# Patient Record
Sex: Female | Born: 1965 | Race: Black or African American | Hispanic: No | Smoking: Never smoker
Health system: Southern US, Community
[De-identification: ages and names within clinical notes are randomized; demographics above are authoritative.]

## PROBLEM LIST (undated history)

## (undated) DIAGNOSIS — Z8249 Family history of ischemic heart disease and other diseases of the circulatory system: Secondary | ICD-10-CM

## (undated) DIAGNOSIS — Z955 Presence of coronary angioplasty implant and graft: Secondary | ICD-10-CM

## (undated) DIAGNOSIS — Z973 Presence of spectacles and contact lenses: Secondary | ICD-10-CM

## (undated) DIAGNOSIS — K760 Fatty (change of) liver, not elsewhere classified: Secondary | ICD-10-CM

## (undated) DIAGNOSIS — I10 Essential (primary) hypertension: Secondary | ICD-10-CM

## (undated) DIAGNOSIS — I2510 Atherosclerotic heart disease of native coronary artery without angina pectoris: Secondary | ICD-10-CM

## (undated) DIAGNOSIS — N950 Postmenopausal bleeding: Secondary | ICD-10-CM

## (undated) DIAGNOSIS — E785 Hyperlipidemia, unspecified: Secondary | ICD-10-CM

## (undated) DIAGNOSIS — D259 Leiomyoma of uterus, unspecified: Secondary | ICD-10-CM

## (undated) HISTORY — PX: CORONARY ANGIOPLASTY WITH STENT PLACEMENT: SHX49

## (undated) HISTORY — DX: Hyperlipidemia, unspecified: E78.5

---

## 1998-02-26 ENCOUNTER — Inpatient Hospital Stay (HOSPITAL_COMMUNITY): Admission: AD | Admit: 1998-02-26 | Discharge: 1998-02-28 | Payer: Self-pay | Admitting: *Deleted

## 1998-08-24 ENCOUNTER — Other Ambulatory Visit: Admission: RE | Admit: 1998-08-24 | Discharge: 1998-08-24 | Payer: Self-pay | Admitting: Obstetrics & Gynecology

## 1999-11-01 ENCOUNTER — Other Ambulatory Visit: Admission: RE | Admit: 1999-11-01 | Discharge: 1999-11-01 | Payer: Self-pay | Admitting: Obstetrics & Gynecology

## 2000-02-16 ENCOUNTER — Ambulatory Visit (HOSPITAL_COMMUNITY): Admission: RE | Admit: 2000-02-16 | Discharge: 2000-02-16 | Payer: Self-pay | Admitting: *Deleted

## 2000-02-16 ENCOUNTER — Encounter: Payer: Self-pay | Admitting: *Deleted

## 2000-11-26 ENCOUNTER — Other Ambulatory Visit: Admission: RE | Admit: 2000-11-26 | Discharge: 2000-11-26 | Payer: Self-pay | Admitting: Obstetrics and Gynecology

## 2001-02-17 ENCOUNTER — Ambulatory Visit (HOSPITAL_COMMUNITY): Admission: RE | Admit: 2001-02-17 | Discharge: 2001-02-17 | Payer: Self-pay | Admitting: Obstetrics and Gynecology

## 2001-02-17 ENCOUNTER — Encounter: Payer: Self-pay | Admitting: Obstetrics and Gynecology

## 2001-12-11 ENCOUNTER — Other Ambulatory Visit: Admission: RE | Admit: 2001-12-11 | Discharge: 2001-12-11 | Payer: Self-pay | Admitting: Obstetrics and Gynecology

## 2002-06-17 ENCOUNTER — Ambulatory Visit (HOSPITAL_COMMUNITY): Admission: RE | Admit: 2002-06-17 | Discharge: 2002-06-17 | Payer: Self-pay | Admitting: Obstetrics and Gynecology

## 2002-06-17 ENCOUNTER — Encounter: Payer: Self-pay | Admitting: Obstetrics and Gynecology

## 2003-02-18 ENCOUNTER — Other Ambulatory Visit: Admission: RE | Admit: 2003-02-18 | Discharge: 2003-02-18 | Payer: Self-pay | Admitting: Obstetrics and Gynecology

## 2003-08-18 ENCOUNTER — Ambulatory Visit (HOSPITAL_COMMUNITY): Admission: RE | Admit: 2003-08-18 | Discharge: 2003-08-18 | Payer: Self-pay | Admitting: Obstetrics and Gynecology

## 2004-02-09 ENCOUNTER — Ambulatory Visit (HOSPITAL_COMMUNITY): Admission: RE | Admit: 2004-02-09 | Discharge: 2004-02-09 | Payer: Self-pay | Admitting: Obstetrics and Gynecology

## 2004-03-07 ENCOUNTER — Other Ambulatory Visit: Admission: RE | Admit: 2004-03-07 | Discharge: 2004-03-07 | Payer: Self-pay | Admitting: Obstetrics and Gynecology

## 2004-09-20 ENCOUNTER — Ambulatory Visit (HOSPITAL_COMMUNITY): Admission: RE | Admit: 2004-09-20 | Discharge: 2004-09-20 | Payer: Self-pay | Admitting: Obstetrics and Gynecology

## 2005-04-27 ENCOUNTER — Encounter: Admission: RE | Admit: 2005-04-27 | Discharge: 2005-04-27 | Payer: Self-pay | Admitting: Internal Medicine

## 2005-06-21 ENCOUNTER — Encounter: Admission: RE | Admit: 2005-06-21 | Discharge: 2005-06-21 | Payer: Self-pay | Admitting: Internal Medicine

## 2005-07-20 ENCOUNTER — Other Ambulatory Visit: Admission: RE | Admit: 2005-07-20 | Discharge: 2005-07-20 | Payer: Self-pay | Admitting: Obstetrics and Gynecology

## 2005-10-30 ENCOUNTER — Ambulatory Visit (HOSPITAL_COMMUNITY): Admission: RE | Admit: 2005-10-30 | Discharge: 2005-10-30 | Payer: Self-pay | Admitting: Obstetrics and Gynecology

## 2006-03-20 ENCOUNTER — Ambulatory Visit: Payer: Self-pay | Admitting: Internal Medicine

## 2006-03-21 ENCOUNTER — Encounter: Admission: RE | Admit: 2006-03-21 | Discharge: 2006-03-21 | Payer: Self-pay | Admitting: Internal Medicine

## 2006-05-01 ENCOUNTER — Ambulatory Visit: Admission: RE | Admit: 2006-05-01 | Discharge: 2006-05-01 | Payer: Self-pay | Admitting: Gynecologic Oncology

## 2006-11-06 ENCOUNTER — Encounter: Admission: RE | Admit: 2006-11-06 | Discharge: 2006-11-06 | Payer: Self-pay | Admitting: Internal Medicine

## 2006-11-20 ENCOUNTER — Encounter: Admission: RE | Admit: 2006-11-20 | Discharge: 2007-02-18 | Payer: Self-pay | Admitting: Internal Medicine

## 2006-11-22 ENCOUNTER — Ambulatory Visit (HOSPITAL_COMMUNITY): Admission: RE | Admit: 2006-11-22 | Discharge: 2006-11-22 | Payer: Self-pay | Admitting: Internal Medicine

## 2010-04-23 ENCOUNTER — Encounter: Payer: Self-pay | Admitting: Obstetrics and Gynecology

## 2010-08-18 NOTE — Consult Note (Signed)
[name], VAL NO.:  [id number]   MEDICAL RECORD NO.:  [id number]          PATIENT TYPE:  OUT   LOCATION:  GYN                          FACILITY:  [facility]   PHYSICIAN:  Paola A. Duard Brady, MD    DATE OF BIRTH:  Mar 11, 1966   DATE OF CONSULTATION:  05/01/2006  DATE OF DISCHARGE:                                 CONSULTATION   Patient seen today in consultation at request of Dr. Normand Sloop.  [name]  is a 45 year old gravida 3, para 2, aborta 1 whose last cycle is April 30, 2006.  She had an ultrasound secondary to some right lower quadrant  gluteal pain that radiated to her groin and back.  Ultrasound revealed a  submucosal fibroid measuring 1.7 cm and intermural fibroid measuring 3.6  cm, and an ovarian cyst.  Followup resolved resolution of that ovarian  cyst and she had a CA-125 January 16 that was normal at 16.9.  The  cystic mass was noted on CT and it resolved on ultrasound performed  January 2.  Patient comes in because she does have a family history of  her mother having ovarian cancer.  She states her mother was diagnosed  with ovarian cancer at the age of 87.  She delayed being seen and died 6  months after her diagnosis.  In fact, in discussing this with the  patient, she is not sure if her mom had ovarian cancer or some other  pathology.  She never had surgery.  She may have had a biopsy, and it is  unclear if this could have been a possible metastatic pancreatic cancer,  colon cancer or other etiology.  She states that the pain that she had  in the right groin region and is now gone.  Occasionally, with her  menses, she have some right lower quadrant discomfort.  She denies any  change her bowels.  She occasionally has some bladder pressure, which  she was told was secondary to her known fibroid uterus.   PAST MEDICAL HISTORY:  None.   PAST SURGICAL HISTORY:  None.   ALLERGIES:  NONE.   MEDICATIONS:  Z-Pak, which she is completing; omega-3;  multivitamin and  Tums.   SOCIAL HISTORY:  She denies the use of tobacco.  She drinks alcohol  occasionally.  She is married.  She works in Conservation officer, historic buildings.  Her  husband works as a Firefighter for [employer].  They have 2  children, a son who is age 69 and daughter who is 8.   FAMILY HISTORY:  Only for that of her mother dying of a diffuse  malignancy at the age of 28; cell type and origin uncertain.   HEALTH MAINTENANCE:  She is up-to-date on her mammogram.  She is due for  a Pap smear in April.   PHYSICAL EXAMINATION:  VITAL SIGNS:  Height 5 feet 4 inches, weight 138  pounds.  GENERAL:  Well-nourished, well-developed female in no acute distress. A  20-minute face-to-face time was spent in consultation only.  Physical  examination was deferred.  At this point, it is difficult to ascertain whether or not her mother  had ovarian cancer at a young age for an ovarian cancer at the age of 41  without any other family history of malignancy.  While sporadic cancer  could appear, I think we need to determine and get some records on her  mother and then followup consultation.  The way we have left it is, as  the patient is essentially asymptomatic at this time, I will not pursue  with any imaging.  She will follow up with Dr. Normand Sloop for her routine  GYN care in April.  The patient's mother was treated at [facility] in [place], [place].  They will obtain records and  fax them to [place].  Once these records are obtained, I will contact the  patient and see if we need to stratify her risk in any other way.  I  discussed with her that the baseline risk of ovarian cancer is  approximately 1.7%; with one first degree family member, the risk rises  to 5%.  The patient has either been pregnant, breast fed, or been on  birth control pills for more than 5 years in total; therefore, her risk  is decreased by approximately 50%.  However, we will complete her   stratification pending her ability to obtain some records on her  mother's diagnosis.      Paola A. Duard Brady, MD  Electronically Signed     PAG/MEDQ  D:  05/01/2006  T:  05/01/2006  Job:  [id number]

## 2010-08-18 NOTE — Assessment & Plan Note (Signed)
[facility]                         GASTROENTEROLOGY OFFICE NOTE   NAME:[name], [name]                            MRN:          [id number]  DATE:03/20/2006                            DOB:          04-May-1965    Ms. [name] is a very nice 45 year old patient of Dr. Lovell Sheehan. 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 About 3 weeks ago, during the exam  time, Ms. Rusk developed severe epigastric pain. It was related to  stress and long hours of studying the day and night. She was drinking a  lot of caffeinated beverages at the time. She was also taking some  NSAIDs because of back pain. She has taken a few Tums when the pain was  the worst, but her pain has completely resolved since then. She has  passed her exams in Anatomy and Physiology and has been able to relax  for the past couple of weeks, which resulted in complete alleviation of  her abdominal pain. She is now able to eat back to regular diet. She  denies any family history of gallbladder disease or any previous attacks  of abdominal pain. There was brief periods of gastroesophageal reflux  during her 2 pregnancies. She denies any lower GI symptoms of diarrhea  or rectal bleeding.   MEDICATIONS:  Advil p.r.n. low back pain.   PAST MEDICAL HISTORY:  No operations. No cardiac or pulmonary problems.   FAMILY HISTORY:  Ovarian cancer in her mother. Diabetes in grandmother.  Heart disease in father.   SOCIAL HISTORY:  The patient is married, has 2 children. 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 The patient does not smoke, drinks  alcohol only rarely.   REVIEW OF SYSTEMS:  Positive for night sweats and hearing problems.   PHYSICAL EXAMINATION:  Blood pressure 130/72, pulse 62 and weight 142  pounds. She was alert, oriented and in no distress.  LUNGS: Clear to auscultation.  COR: With normal S1, normal S2.  NECK: Supple, without adenopathy.  ABDOMEN: Was relaxed with normoactive  bowel sounds. I could not  reproduce any tenderness in epigastrium, left or right upper quadrant or  costovertebral angle.  RECTAL: Shows normal rectal tone with Hemoccult negative stool.   IMPRESSION:  A 45 year old African American female with episode of  severe epigastric pain, which resolved after her exams in school and was  undoubtedly related to stress. I suspect she had a gastric spasm,  hyperacidity or even gastritis. She does not have any chronic problems  pertaining to her stomach and I feel that her symptoms were also  aggravated by use of non-steroidal anti-inflammatory drugs. For that  reason, I do not feel that further evaluation is warranted unless she  develops recurrent problems. She agrees with the plan.   PLAN:  1. Prilosec 20 mg a day, to use on an as needed basis. Specifically      she ought to take it before her exam period next semester.  2. I would avoid using excess caffeine during the exams as well as  excess non-steroidal anti-inflammatory drugs.  3. If the symptoms of pain recur, I think she will need upper      endoscopy,  other diagnostic tests, possibly upper abdominal      ultrasound for further evaluation of abdominal pain.     [name]. Juanda Chance, MD  Electronically Signed    DMB/MedQ  DD: 03/20/2006  DT: 03/20/2006  Job #: [id number]   cc:   Della Goo, M.D.

## 2010-08-18 NOTE — Consult Note (Signed)
NAMEBRANDE, UNCAPHER NO.:  [id number]   MEDICAL RECORD NO.:  [id number]          PATIENT TYPE:  OUT   LOCATION:  GYN                          [facility]ACILITY:  [facility]   PHYSICIAN:  Paola A. Duard Brady, MD    DATE OF BIRTH:  Aug 29, 1965   DATE OF CONSULTATION:  07/23/2006  DATE OF DISCHARGE:  05/01/2006                                 CONSULTATION   This is a recording of a telephone conversation regarding [name].  [name] is a very pleasant 45 year-old that was referred to me for  family history of ovarian carcinoma regarding the need of close follow-  up.  She was very unclear regarding what her mother's diagnosis was.  Therefore we asked that she receive records from [facility] regarding her mother's condition.  From the records we have her  mother was 68 years old.  Was found to have a large abdominal pelvic  mass with multiple lung metastases.  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  She was transfused and monitored  and managed with best supportive care and expectantly.  She succumbed to  her disease.  I left a voice mail on the patient's cell phone number  stating that uterine sarcomas in and of themselves are not anything that  we screen for in a genetic fashion.  I believe she can proceed with  routine GYN care under the care of Dr. Normand Sloop as she has been.  We  would be happy to see her in the future should there be a concern of a  gynecologic malignancy, but at this time I do not feel that she needs to  be involved in any ovarian cancer screening project or regimen based on  her mother's family history of uterine sarcoma.      Paola A. Duard Brady, MD  Electronically Signed     PAG/MEDQ  D:  07/23/2006  T:  07/23/2006  Job:  [id number]   cc:   Naima A. Normand Sloop, M.D.  Fax: [phone]   Telford Nab, R.N.  [address]  [place], [place] [address]

## 2010-09-26 DIAGNOSIS — Z955 Presence of coronary angioplasty implant and graft: Secondary | ICD-10-CM

## 2010-09-26 HISTORY — DX: Presence of coronary angioplasty implant and graft: Z95.5

## 2010-12-26 ENCOUNTER — Observation Stay (HOSPITAL_COMMUNITY): Payer: BC Managed Care – PPO

## 2010-12-26 ENCOUNTER — Observation Stay (HOSPITAL_COMMUNITY)
Admission: AD | Admit: 2010-12-26 | Discharge: 2010-12-28 | DRG: 854 | Disposition: A | Discharge status: Home / Self Care | Payer: BC Managed Care – PPO | Source: Ambulatory Visit | Attending: Internal Medicine | Admitting: Internal Medicine

## 2010-12-26 ENCOUNTER — Emergency Department (HOSPITAL_COMMUNITY)
Admission: EM | Admit: 2010-12-26 | Discharge: 2010-12-26 | Disposition: A | Discharge status: Home / Self Care | Payer: BC Managed Care – PPO | Attending: Emergency Medicine | Admitting: Emergency Medicine

## 2010-12-26 DIAGNOSIS — R0602 Shortness of breath: Secondary | ICD-10-CM | POA: Insufficient documentation

## 2010-12-26 DIAGNOSIS — I2510 Atherosclerotic heart disease of native coronary artery without angina pectoris: Principal | ICD-10-CM | POA: Insufficient documentation

## 2010-12-26 DIAGNOSIS — I200 Unstable angina: Secondary | ICD-10-CM | POA: Insufficient documentation

## 2010-12-26 DIAGNOSIS — R079 Chest pain, unspecified: Secondary | ICD-10-CM | POA: Insufficient documentation

## 2010-12-26 DIAGNOSIS — I4949 Other premature depolarization: Secondary | ICD-10-CM | POA: Insufficient documentation

## 2010-12-26 DIAGNOSIS — E785 Hyperlipidemia, unspecified: Secondary | ICD-10-CM | POA: Insufficient documentation

## 2010-12-26 DIAGNOSIS — Z8249 Family history of ischemic heart disease and other diseases of the circulatory system: Secondary | ICD-10-CM | POA: Insufficient documentation

## 2010-12-26 LAB — DIFFERENTIAL
Basophils Relative: 0 % (ref 0–1)
Eosinophils Absolute: 0 10*3/uL (ref 0.0–0.7)
Lymphs Abs: 1.3 10*3/uL (ref 0.7–4.0)
Monocytes Relative: 7 % (ref 3–12)
Neutro Abs: 2.6 10*3/uL (ref 1.7–7.7)
Neutrophils Relative %: 62 % (ref 43–77)

## 2010-12-26 LAB — COMPREHENSIVE METABOLIC PANEL
ALT: 19 U/L (ref 0–35)
Albumin: 4.2 g/dL (ref 3.5–5.2)
Alkaline Phosphatase: 60 U/L (ref 39–117)
BUN: 10 mg/dL (ref 6–23)
CO2: 21 mEq/L (ref 19–32)
Calcium: 9.1 mg/dL (ref 8.4–10.5)
GFR calc Af Amer: >60 mL/min (ref >60)
Potassium: 3.8 mEq/L (ref 3.5–5.1)
Sodium: 136 mEq/L (ref 135–145)
Total Protein: 7.4 g/dL (ref 6.0–8.3)

## 2010-12-26 LAB — LIPID PANEL
Cholesterol: 188 mg/dL (ref 0–200)
HDL: 70 mg/dL (ref >39)
LDL Cholesterol: 105 mg/dL — ABNORMAL HIGH (ref 0–99)
Total CHOL/HDL Ratio: 2.7 RATIO
Triglycerides: 63 mg/dL (ref <150)

## 2010-12-26 LAB — CBC
Hemoglobin: 12.7 g/dL (ref 12.0–15.0)
MCH: 28.9 pg (ref 26.0–34.0)
RBC: 4.4 MIL/uL (ref 3.87–5.11)
WBC: 4.3 10*3/uL (ref 4.0–10.5)

## 2010-12-26 LAB — CARDIAC PANEL(CRET KIN+CKTOT+MB+TROPI)
CK, MB: 2.4 ng/mL (ref 0.3–4.0)
CK, MB: 4.4 ng/mL — ABNORMAL HIGH (ref 0.3–4.0)
Relative Index: 2.3 (ref 0.0–2.5)
Total CK: 105 U/L (ref 7–177)
Total CK: 270 U/L — ABNORMAL HIGH (ref 7–177)
Troponin I: <0.3 ng/mL (ref <0.30)
Troponin I: <0.3 ng/mL (ref <0.30)

## 2010-12-26 LAB — APTT: aPTT: 32 seconds (ref 24–37)

## 2010-12-27 LAB — CARDIAC PANEL(CRET KIN+CKTOT+MB+TROPI)
CK, MB: 3.6 ng/mL (ref 0.3–4.0)
Relative Index: 1.4 (ref 0.0–2.5)
Total CK: 249 U/L — ABNORMAL HIGH (ref 7–177)
Troponin I: <0.3 ng/mL (ref <0.30)

## 2010-12-27 LAB — POCT ACTIVATED CLOTTING TIME: Activated Clotting Time: 622 seconds

## 2010-12-28 LAB — BASIC METABOLIC PANEL
Calcium: 8.2 mg/dL — ABNORMAL LOW (ref 8.4–10.5)
Chloride: 108 mEq/L (ref 96–112)
Creatinine, Ser: 0.85 mg/dL (ref 0.50–1.10)
GFR calc Af Amer: >60 mL/min (ref >60)
GFR calc non Af Amer: >60 mL/min (ref >60)
Glucose, Bld: 95 mg/dL (ref 70–99)

## 2010-12-28 LAB — CBC
HCT: 33.5 % — ABNORMAL LOW (ref 36.0–46.0)
MCH: 28.8 pg (ref 26.0–34.0)
MCV: 88.6 fL (ref 78.0–100.0)
Platelets: 159 10*3/uL (ref 150–400)
RDW: 13.5 % (ref 11.5–15.5)

## 2010-12-30 NOTE — Consult Note (Signed)
[name], [name]                ACCOUNT NO.:  [id number]  MEDICAL RECORD NO.:  [id number]  LOCATION:  3734                         FACILITY:  [facility]  PHYSICIAN:  Armanda Magic, M.D.     DATE OF BIRTH:  Mar 30, 1966  DATE OF CONSULTATION:  12/26/2010 DATE OF DISCHARGE:                                CONSULTATION   REFERRING PHYSICIAN:  Della Goo, MD  PRIMARY CARDIOLOGIST:  Lyn Records, MD  CHIEF COMPLAINT:  Chest pain.  HISTORY OF PRESENT ILLNESS:  This is a 45 year old female who was seen Dr. Verdis Prime in 2009 with history of palpitations and was diagnosed with an isolated PVC on a heart monitor.  A 2-D echocardiogram at that time showed normal LV function.  She has some family history of coronary artery disease in early age.  She was seen in our office recently because of left-sided chest pain while exercising and walking, described as the aching sensation in the left over arm and chest whenever she exercises or walks.  It has been going on for about 3 weeks.  She says it only lasted about a minute and then she continues to walk, it resolved, does not return.  She denies any nausea, vomiting, shortness of breath, PND, orthopnea or palpitations or dizziness with the episodes.  Apparently, she has undergone tremendous stress in a nursing school and works as a Lawyer in ICU at [employer].  She is also currently going through divorce and raising her children.  She has been very stressed and fearful that her chest pain and given her family history. She underwent an exercise treadmill test in our office yesterday and reportedly per Dr. Katrinka Blazing, had upsloping ST-segment depression along with chest pain.  She had recurrent chest pain this morning when walking from her car and called Dr. Lovell Sheehan and presented to the hospital for further workup.  She currently is pain free.  PAST MEDICAL HISTORY:  Palpitations with an isolated PVC noted on the heart monitor in 2009.  A 2-D  echocardiogram in June 2009 showed EF of 75%.  FAMILY HISTORY:  Her father died at 36 of CAD.  Her mother died at 49 of a sarcoma.  She has two brothers and a sister.  Her sister and one brother had hypertension.  Her father's side of the family has a history of sudden cardiac death.  SOCIAL HISTORY:  She has never smoked.  She is single.  MEDICATIONS:  Aspirin 81 mg daily.  ALLERGIES:  ERYTHROMYCIN which causes stomach cramps.  PHYSICAL EXAMINATION:  GENERAL:  This is a well-developed, well- nourished black female in no distress. HEENT:  Benign. NECK:  Supple without lymphadenopathy.  Carotid upstrokes 6+ bilaterally, no bruits. LUNGS:  Clear to auscultation throughout. HEART:  Regular rate and rhythm.  No murmurs, rubs, or gallops.  Normal S1 and S2. ABDOMEN:  Soft, nontender, nondistended.  Active bowel sounds.  No hepatosplenomegaly: EXTREMITIES:  No edema.  EKG done in our office on December 15, 2010, showed normal sinus rhythm at 57 beats per minute with no ST changes.  EKG has not been done this admission yet.  Labs are pending.  Chest x-ray pending.  ASSESSMENT:  1. Unstable angina pectoris mainly exertional angina with any type of     exertion now, cardiac enzymes are pending.  Treadmill yesterday in     our office showed upsloping ST depression in the setting of chest     pain.  She is now admitted with recurrent chest pain this morning     after walking from her car.  Currently, she is pain free. 2. Normal left ventricular function by echo in 2009 with systolic     heart murmur on exam today. 3. Premature ventricular contractions with history of palpitations. 4. Family history of coronary artery disease.  PLAN:  We will plan cardiac catheterization in the morning on December 27, 2010, by Dr. Verdis Prime.  We will cycle cardiac enzymes.  We will increase Lovenox to 1 mg/kg until rule out is complete.  Agree with aspirin and nitro paste as ordered by Dr. Lovell Sheehan.   Risks and benefits of cardiac cath explained including the risk of MI, CVA, death, bleeding complications, vascular complications, IV contrast allergy, and renal failure.  The patient understands, all questions were answered, and she agrees to proceed with cardiac catheterization.     Armanda Magic, M.D.     TT/MEDQ  D:  12/26/2010  T:  12/26/2010  Job:  [id number]  cc:   Lyn Records, M.D. Della Goo, M.D.  Electronically Signed by Armanda Magic M.D. on 12/30/2010 04:51:42 PM

## 2010-12-31 NOTE — H&P (Signed)
[name], [name]                ACCOUNT NO.:  [id number]  MEDICAL RECORD NO.:  [id number]  LOCATION:  3734                         FACILITY:  [facility]  PHYSICIAN:  Della Goo, M.D. DATE OF BIRTH:  Jan 04, 1966  DATE OF ADMISSION:  12/26/2010 DATE OF DISCHARGE:                             HISTORY & PHYSICAL   PRIMARY CARE PHYSICIAN:  Della Goo, M.D.  CHIEF COMPLAINT:  Chest pain.  HISTORY OF PRESENT ILLNESS:  This is a 45 year old female with a very strong family history for coronary artery disease who presented with complaints of severe exertional chest pain which starts in the left chest, radiates down the left arm.  She had her last episode of this, this morning, it occurred with exertion while she was walking to her classes.  She came into the office and was brought to the hospital for further evaluation.  The patient reports having shortness of breath and some nausea associated with this.  She denies having any diaphoresis or vomiting.  She has been having intermittent chest pain off and on for the past 3 weeks, which has been similar, but reports that the pain was worse today.  The patient also underwent a exercise stress test performed with [facility] cardiologist Dr. Verdis Prime, results of which were equivocal.  She did suffer ischemic changes at the end of the stress test that were associated with chest pain.  The patient was to have further testing performed based on these conclusions.  An exercise stress test was to be scheduled.  However, the patient continued to have chest pain into the day today and was brought to the emergency department and hospital for admission and further workup.  Cardiology was notified and are also seeing the patient.  PAST MEDICAL HISTORY:  None.  MEDICATIONS: 1. Aspirin 81 mg p.o. daily. 2. Fish oil 1 tablet p.o. daily 1000 units.  ALLERGIES:  ERYTHROMYCIN which is an intolerance causing GI cramping.  SOCIAL HISTORY:  The  patient is divorced, mother of two children.  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  She is a nonsmoker, nondrinker.  No history of illicit drug usage.  FAMILY HISTORY:  Strongly positive for coronary artery disease.  Father deceased in his late 60s of massive MI.  Niece deceased of a massive MI at age 53, 1 year ago.  Paternal uncle is also positive for coronary artery disease and deceased in the 29s.  Mother had probable uterine sarcoma and died of cancer.  REVIEW OF SYSTEMS:  Pertinent are mentioned above in the HPI.  All other organ systems negative.  PHYSICAL EXAMINATION FINDINGS:  GENERAL:  This is a thin, well- nourished, well-developed [place] American female who is in no current discomfort or acute distress. VITAL SIGNS:  On arrival to the floor, temperature 98.9, blood pressure 147/91, heart rate 81, respirations 18, and O2 saturations 100%. HEENT:  Normocephalic, atraumatic.  Pupils are equally round, reactive to light.  Extraocular movements are intact.  Funduscopic benign.  There is no scleral icterus.  Nares are patent bilaterally.  Oropharynx is clear. NECK:  Supple.  Full range of motion.  No thyromegaly, adenopathy, jugular venous distention. CARDIOVASCULAR:  Regular rate and rhythm.  Normal S1 and S2.  No murmurs, gallops, or rubs appreciated. LUNGS:  Clear to auscultation bilaterally.  No rales, rhonchi, or wheezes. ABDOMEN:  Positive bowel sounds, soft, nontender, nondistended.  No hepatosplenomegaly. EXTREMITIES:  Without cyanosis, clubbing, or edema. NEUROLOGIC:  Alert and oriented x3.  No focal deficits.  LABORATORY STUDIES:  Pending at this time.  Admission laboratory studies have been ordered along with cardiac enzymes and a D-dimer.  The admission EKG reveals a normal sinus rhythm and no acute ST-segment changes are seen.  The rate of 75.  A PA and lateral chest x-ray will also be performed.  ASSESSMENT:  A 45 year old female  being admitted with 1. Exertional chest pain. 2. Shortness of breath. 3. Nausea. 4. Abnormal exercise stress test. 5. Elevated blood pressure most likely reactive.  No current history     of hypertension.  PLAN:  The patient has been admitted and she has been placed in a telemetry unit for now.  Cardiac enzymes will be performed along with a D-dimer and a PA and lateral chest x-ray.  Cardiology has seen the patient and has made plans for a cardiac catheterization in the a.m. The patient will be n.p.o. after midnight.  She has been placed on full- dose Lovenox therapy at this time until all of the rule out have been completed.  The A TSH level will be checked along with a fasting lipid panel.  In the past, the patient has had normal cholesterol levels. Admission laboratory studies will be appended to this dictation.  The patient is a full code and further workup will ensue pending results the patient's clinical course and are outlined above.     Della Goo, M.D.     HJ/MEDQ  D:  12/26/2010  T:  12/26/2010  Job:  [id number]  Electronically Signed by Della Goo M.D. on 12/31/2010 10:22:27 PM

## 2010-12-31 NOTE — Discharge Summary (Signed)
[name], [name]                ACCOUNT NO.:  [id number]  MEDICAL RECORD NO.:  [id number]  LOCATION:  2504                         FACILITY:  [facility]  PHYSICIAN:  Della Goo, M.D. DATE OF BIRTH:  October 16, 1965  DATE OF ADMISSION:  12/26/2010 DATE OF DISCHARGE:  12/28/2010                              DISCHARGE SUMMARY   CONSULTANTS:  [facility], Lyn Records, M.D. and Armanda Magic, M.D.  PROCEDURES:  Cardiac catheterization performed on December 27, 2010.  DISCHARGE DIAGNOSES: 1. 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 2. Unstable angina pectoris. 3. Hyperlipidemia, resolved elevated blood pressure with no history of     hypertension.  HOSPITAL COURSE:  This is a 45 year old female who is admitted to the hospital on December 26, 2010, after suffering exertional chest pain in the a.m. while she was walking to her classes.  The pain was in the left chest radiating into the left arm.  She rated the pain as being in 08/10 at that time, and the pain was worse than it had been previously.  She had been suffering off and on for the past 3 weeks with exertional chest pain.  She had been seen on December 25, 2010, and underwent an exercise stress test as an outpatient with Dr. Verdis Prime, Cardiology. Results of which were equivocal and plans were made at that time for her to follow up and have a nuclear stress test; however, the next day, she began to have worsening of the chest pain that had been associated with shortness of breath as well as nausea.  The patient was directly admitted to the [facility] to the telemetry unit for further evaluation, testing, and treatment.  She was seen by Cardiology, Dr. Armanda Magic, the on-call cardiologist and had the initial evaluation, and basically, a decision was made for [facility] to undergo a  cardiac catheterization in the a.m. December 27, 2010.  The patient had the cardiac catheterization performed.  Results of which revealed two lesions in the left anterior descending artery with stenosis distally ranging from 50%-70%, but two lesions of the LAD had been seen and stenting was performed in those two areas.  The patient was placed on the IV nitroglycerin drip and also placed on continued aspirin therapy. She had no acute events following this procedure and recovered well and was feeling better.  The patient was seen in the a.m., cleared by Cardiology to be discharged to home and was discharged to home in stable condition and pain-free.  PHYSICAL EXAMINATION:  VITAL SIGNS:  On discharge, temperature 98.1, blood pressure 119/69, heart rate 68, respirations 20, O2 saturations 100% on room air. HEENT:  Normocephalic, atraumatic.  Pupils equally round, reactive to light.  Extraocular movements are intact.  Funduscopic benign.  There is no scleral icterus.  Nares are patent bilaterally.  Oropharynx is clear. NECK:  Supple.  Full range of motion.  No thyromegaly, adenopathy, jugular venous distention. CARDIOVASCULAR:  Regular rate and rhythm.  Normal S1, S2.  No murmurs, gallops or rubs appreciated.  Chest  wall nontender.  Breathing is unlabored.  Chest wall excursion symmetric. LUNGS:  Clear to auscultation bilaterally.  No rales, rhonchi, or wheezes appreciated. ABDOMEN:  Positive bowel sounds, soft, nontender, nondistended.  No hepatosplenomegaly. EXTREMITIES:  Without cyanosis, clubbing, or edema.  No focal deficits on examination.  LABORATORY STUDIES:  In the a.m. revealed a white blood cell count of 4.3, hemoglobin 10.9, hematocrit 33.5, MCV 88.6, platelets 159. Chemistry with a sodium of 140, potassium 3.7, chloride 108, CO2 of 25, BUN 8, creatinine 0.85, and glucose 95.  DISCHARGE MEDICATIONS:  Included Effient 10 mg one p.o. daily, atorvastatin 20 mg one p.o.  daily, aspirin 81 mg one p.o. daily, and the patient will resume her over-the-counter calcium with vitamin D.  The patient is to followup with you [facility], Dr. Verdis Prime on Thursday, January 04, 2011, at 9:00 a.m. for hospital followup.  She is also to followup in the office with me, Dr. Della Goo, on Friday January 05, 2011, at 9:00 a.m..  The patient activity level will be limited for 1 week without strenuous activity, and following that week, she is to resume her normal activities unless her condition changes.     Della Goo, M.D.     HJ/MEDQ  D:  12/28/2010  T:  12/28/2010  Job:  [id number]  Electronically Signed by Della Goo M.D. on 12/31/2010 10:23:37 PM

## 2010-12-31 NOTE — H&P (Signed)
  [name], [name]                ACCOUNT NO.:  [id number]  MEDICAL RECORD NO.:  [id number]  LOCATION:  3734                         FACILITY:  [facility]  PHYSICIAN:  Della Goo, M.D. DATE OF BIRTH:  28-Dec-1965  DATE OF ADMISSION:  12/26/2010 DATE OF DISCHARGE:                             HISTORY & PHYSICAL   ADDENDUM  The admission laboratory studies have returned and the results are with a white blood cell count of 4.3, hemoglobin 12.7, hematocrit 38.7, MCV 88.0, platelets 203, neutrophils 62%, lymphocytes 30%.  Chemistry with a sodium of 136, potassium 3.8, chloride 104, CO2 21, glucose 90, BUN 10, creatinine 0.78.  GFR greater than 60.  Total bilirubin 0.3, alkaline phosphatase 60, AST 40 which is mildly elevated, ALT 19, total protein 7.4, albumin 4.2, calcium 9.1.  ProTime 13.4, INR 1.0, PTT 32.  Fasting lipid profile; total cholesterol of 188, LDL of 105, HDL is 70, triglycerides of 63, VLDL was 13, and the total cholesterol to HDL ratio is 2.7.  Cardiac panel with a total creatine kinase of 105, CK-MB 2.4, relative index 2.3, and troponin less than 0.30.  D-dimer 0.27.  A PA and lateral chest x-ray has been ordered.  The results are pending at this time.     Della Goo, M.D.     HJ/MEDQ  D:  12/26/2010  T:  12/26/2010  Job:  [id number]  Electronically Signed by Della Goo M.D. on 12/31/2010 10:22:48 PM

## 2011-01-11 NOTE — Cardiovascular Report (Signed)
[name], [name]                ACCOUNT NO.:  [id number]  MEDICAL RECORD NO.:  [id number]  LOCATION:  2504                         FACILITY:  [facility]  PHYSICIAN:  Lyn Records, M.D.   DATE OF BIRTH:  Apr 05, 1965  DATE OF PROCEDURE:  09/26/2010 DATE OF DISCHARGE:                           CARDIAC CATHETERIZATION   INDICATIONS FOR PROCEDURE:  Unstable angina pectoris with angina on minimal exertion and recent abnormal exercise treadmill test.  PROCEDURES PERFORMED: 1. Left heart catheterization via right radial approach complicated by     catheter trapping due to spasm. 2. Left heart catheterization via right femoral approach. 3. Selective engagement of the right subclavian for antegrade     injection of verapamil and other vasodilators. 4. Left heart catheterization. 5. Selective coronary angiography. 6. Drug-eluting stent implantation in the mid and distal left anterior     descending.  Note:  This was a prolonged and complex procedure requiring greater than 2-1/2 hours to complete.  SURGEON:  Lyn Records, MD  DESCRIPTION OF PROCEDURE:  After discussing the pros and cons of catheterization and possible percutaneous coronary intervention, we proceeded with catheterization using the right radial approach.  We were able to advance a steerable guidewire into the left subclavian.  We advanced the 5-French multipurpose catheter but became stuck in the patient's mid brachial region.  We could not advance nor withdraw the catheter.  Movement of the catheter lead to severe arm pain.  We then began giving intra-arterial doses of diltiazem followed by a mixture of verapamil, Xylocaine, and nitroglycerin.  We started on IV nitroglycerin drip.  We gave more sedation.  We were still unable to move the catheter.  We did not angiographic image and demonstrated that we had actually in a brachial recurrent vessel that was a slightly smaller than the diameter of the diagnostic catheter.   We could see reflux of contrast into the true brachial.  We had good radial plethysmography waveform.  We then did a right femoral stick and advanced a 5-French multipurpose catheter into the innominate and then out into the right subclavian where we used this catheter to administer antegrade vasodilators into the subclavian/brachial artery junction.  We gave a total of 15 mg of verapamil intra-arterially.  We gave more sedation. We wrapped her arm in warm blankets.  At some later time, we were able to pull the catheter out of the brachial area with some resistance but no persisting pain or evidence of bleeding.  After watching her arm for a bit, we removed the radial sheath, placed a wristband, and performed PCI on the LAD.  The time required to remove our equipment from the right arm was in excess of 1 hour.  We used a 5-French multipurpose catheter to obtain diagnostic shots of the coronaries and the left ventricle.  Because of her aortic root anatomy and small diameter, we were unable to get coaxial catheter placement with the multipurpose catheter and used a 3.5 x 5 French left Judkins catheter and multiple right coronary catheters ultimately engaging the right coronary with an internal mammary catheter.  After documenting wide patency of the circumflex, left main, and right coronary, it became obvious  to me that the patient's symptoms and treadmill abnormality were due to moderately severe disease in the mid LAD and severe disease in the distal LAD.  After some discussion with the patient, we proceeded with PCI.  She had already received a total of approximately 6000 units of heparin at the beginning of the radial portion of this case.  ACT was documented be 240.  I went ahead and gave a bolus followed by an infusion of Angiomax and we loaded the patient with Effient.  We then performed PCI using a 6-French 3.0 cm XB LAD guide catheter.  We also used a BMW wire to cross the  stenoses in the LAD.  We used cutting balloon, 6 mm in length, 2.5 mm in diameter on the distal lesion and then we positioned and deployed a 3.0 x 12 mm long Resolute stent.  We postdilated with a 3.0 x 8 mm long Easton balloon to 14 atmospheres.  We then direct stented the mid LAD tandem lesions from 70% to 0% with a 3.5 x 18 mm long Resolute postdilated with a 3.75 x 15 mm long balloon to 14 atmospheres.  The patient tolerated the PCI without complications.  No bleeding was noted in the right arm or the radial. The femoral sheath will be removed according to protocol.  This ultimately ended up being a successful but prolonged and complicated case due to the situation with the radial approach.  RESULTS: 1. Hemodynamic data:     a.     Aortic pressure 102/60 mmHg.     b.     Left ventricular pressure 102/7 mmHg. 2. Left ventriculography:  LV is hyperdynamic.  EF is 70%. 3. Coronary angiography.     a.     Left main widely patent.     b.     Left anterior descending coronary:  The left anterior      descending coronary is dominant.  It wraps around the left      ventricular apex.  It gives origin to 3 diagonal branches.  First   and second diagonals are moderate in size.  The third diagonal is      relatively small.  There is an eccentric 70% stenosis followed by      50% stenosis in the mid LAD after the second diagonal.  There is      an 80-90% stenosis in the distal LAD before a large and long      apical segment after the third diagonal.  This distal lesion is      likely to represent the patient's culprit lesion.     c.     Circumflex artery:  Circumflex coronary artery is large and      trifurcates on the left lateral wall.     d.     Right coronary:  The right coronary artery is codominant and      was difficult to cannulate but is free of any significant      obstruction. 4. PCI:  The LAD mid disease is reduced from 70% to 0% with TIMI grade     3 flow and the distal LAD disease  is reduced from 80-90% to 0% with     TIMI grade 3 flow after stenting.  CONCLUSIONS: 1. Unstable angina pectoris with exertional angina and abnormal     exercise treadmill test done recently. 2. Successful stenting of the mid and distal left anterior descending. 3. Complicated radial approach due to entrapment of  the diagnostic     catheter in the brachial area after advancing the catheter into a     recurrent brachial artery that was about the same size of the     diagnostic catheter itself.  We were able to retrieve the catheter     after massive amounts of vasodilator therapy, warm compresses, and     sedation. 4. Normal left ventricular function. 5. No apparent bleeding complications at this point and excellent     pulses in the patient's right arm.  PLAN:  Effient and aspirin will be used.  The patient will be discharged within 24 hours with no complications.     Lyn Records, M.D.     HWS/MEDQ  D:  12/27/2010  T:  12/27/2010  Job:  [id number]  Electronically Signed by Verdis Prime M.D. on 01/11/2011 11:03:01 AM

## 2011-11-30 ENCOUNTER — Telehealth: Payer: Self-pay | Admitting: Obstetrics and Gynecology

## 2011-12-14 ENCOUNTER — Ambulatory Visit (INDEPENDENT_AMBULATORY_CARE_PROVIDER_SITE_OTHER): Payer: BC Managed Care – PPO | Admitting: Obstetrics and Gynecology

## 2011-12-14 ENCOUNTER — Encounter: Payer: Self-pay | Admitting: Obstetrics and Gynecology

## 2011-12-14 VITALS — BP 122/72 | HR 74 | Wt 160.0 lb

## 2011-12-14 DIAGNOSIS — Z01419 Encounter for gynecological examination (general) (routine) without abnormal findings: Secondary | ICD-10-CM

## 2011-12-14 DIAGNOSIS — N920 Excessive and frequent menstruation with regular cycle: Secondary | ICD-10-CM

## 2011-12-14 DIAGNOSIS — Z124 Encounter for screening for malignant neoplasm of cervix: Secondary | ICD-10-CM

## 2011-12-14 DIAGNOSIS — B3731 Acute candidiasis of vulva and vagina: Secondary | ICD-10-CM

## 2011-12-14 DIAGNOSIS — B373 Candidiasis of vulva and vagina: Secondary | ICD-10-CM

## 2011-12-14 LAB — POCT WET PREP (WET MOUNT)
Whiff Test: NEGATIVE
pH: 5

## 2011-12-14 MED ORDER — NYSTATIN-TRIAMCINOLONE 100000-0.1 UNIT/GM-% EX OINT: TOPICAL_OINTMENT | Freq: Two times a day (BID) | CUTANEOUS | Status: DC

## 2011-12-14 MED ORDER — TERCONAZOLE 0.4 % VA CREA: 1.0000 | TOPICAL_CREAM | Freq: Every day | VAGINAL | Status: AC

## 2011-12-14 NOTE — Progress Notes (Signed)
Subjective:    [name] is a 46 y.o. female, [id number], who presents for an annual exam. The patient reports heavy vaginal bleeding over the past year (before starting anti-platelets).  Flow lasts for 7 days with clotting and pad change (day 1) 12 pads/day due to gushes of blood.  Remaining days pad changes every 2 hours.  Has just begun iron supplementation due to anemia. Has severe cramping on the first day of menses when she passes clots.  Gets some relief from Tylenol. Denies intermenstrual bleeding. Lastly has some itchiness around the time of menses.  Menstrual cycle:   LMP: Patient's last menstrual period was 11/22/2011.             Review of Systems Pertinent items are noted in HPI. Denies pelvic pain, urinary tract symptoms, vaginitis symptoms, irregular bleeding, menopausal symptoms, change in bowel habits or rectal bleeding   Objective:    BP 122/72  Pulse 74  Wt 160 lb (72.576 kg)  LMP 11/22/2011     Wt Readings from Last 1 Encounters:  12/14/11 160 lb (72.576 kg)   There is no height on file to calculate BMI. General Appearance: Alert, no acute distress HEENT: Grossly normal Neck / Thyroid: Supple, no thyromegaly or cervical adenopathy Lungs: Clear to auscultation bilaterally Back: No CVA tenderness Breast Exam: No masses or nodes.No dimpling, nipple retraction or discharge. Cardiovascular: Regular rate and rhythm.  Gastrointestinal: Soft, non-tender, no masses or organomegaly Pelvic Exam: EGBUS-wnl, vagina-normal rugae, cervix- without lesions or tenderness, uterus appears normal size but irregular, adnexae-no masses or tenderness Rectovaginal: no masses and normal sphincter tone Lymphatic Exam: Non-palpable nodes in neck, clavicular,  axillary, or inguinal regions  Skin: no rashes or abnormalities Extremities: no clubbing cyanosis or edema  Neurologic: grossly normal Psychiatric: Alert and oriented  Wet Prep:  ph-5.0,  whiff-negative, yeast-few   Assessment:    Routine GYN Exam Perimenstrual Vaginitis Symptoms S/P Cardiac Stents Yeast Vaginitis Anemia Plan:  Reviewed management options: mirea, ablation, hsc/d&c with resection of sub-mucosal fibroid if present-information given on each  SHG-pending  PAP sent  RTO 1 year or prn  Coreen Shippee,ELMIRAPA-C

## 2011-12-14 NOTE — Progress Notes (Signed)
Regular Periods: yes Mammogram: yes  Monthly Breast Ex.: yes Exercise: yes  Tetanus < 10 years: yes Seatbelts: yes  NI. Bladder Functn.: yes Abuse at home: no  Daily BM's: yes Stressful Work: no  Healthy Diet: yes Sigmoid-Colonoscopy: no  Calcium: yes Medical problems this year:heavy cycles and vaginal itch    LAST PAP:6/10  Contraception: spouse had vas.  Mammogram:  2012  PCP: DR. Lovell Sheehan   PMH: CATHERIZATION (CARDIAC)  9/12  [facility]: PT IS NOW DIVORCED  Last Bone Scan: NO

## 2011-12-14 NOTE — Patient Instructions (Addendum)
Endometrial Ablation Endometrial ablation removes the lining of the uterus (endometrium). It is usually a same day, outpatient treatment. Ablation helps avoid major surgery (such as a hysterectomy). A hysterectomy is removal of the cervix and uterus. Endometrial ablation has less risk and complications, has a shorter recovery period and is less expensive. After endometrial ablation, most women will have little or no menstrual bleeding. You may not keep your fertility. Pregnancy is no longer likely after this procedure but if you are pre-menopausal, you still need to use a reliable method of birth control following the procedure because pregnancy can occur. REASONS TO HAVE THE PROCEDURE MAY INCLUDE:  Heavy periods.   Bleeding that is causing anemia.   Anovulatory bleeding, very irregular, bleeding.   Bleeding submucous fibroids (on the lining inside the uterus) if they are smaller than 3 centimeters.  REASONS NOT TO HAVE THE PROCEDURE MAY INCLUDE:  You wish to have more children.   You have a pre-cancerous or cancerous problem. The cause of any abnormal bleeding must be diagnosed before having the procedure.   You have pain coming from the uterus.   You have a submucus fibroid larger than 3 centimeters.   You recently had a baby.   You recently had an infection in the uterus.   You have a severe retro-flexed, tipped uterus and cannot insert the instrument to do the ablation.   You had a Cesarean section or deep major surgery on the uterus.   The inner cavity of the uterus is too large for the endometrial ablation instrument.  RISKS AND COMPLICATIONS   Perforation of the uterus.   Bleeding.   Infection of the uterus, bladder or vagina.   Injury to surrounding organs.   Cutting the cervix.   An air bubble to the lung (air embolus).   Pregnancy following the procedure.   Failure of the procedure to help the problem requiring hysterectomy.   Decreased ability to diagnose  cancer in the lining of the uterus.  BEFORE THE PROCEDURE  The lining of the uterus must be tested to make sure there is no pre-cancerous or cancer cells present.   Medications may be given to make the lining of the uterus thinner.   Ultrasound may be used to evaluate the size and look for abnormalities of the uterus.   Future pregnancy is not desired.  PROCEDURE  There are different ways to destroy the lining of the uterus.   Resectoscope - radio frequency-alternating electric current is the most common one used.   Cryotherapy - freezing the lining of the uterus.   Heated Free Liquid - heated salt (saline) solution inserted into the uterus.   Microwave - uses high energy microwaves in the uterus.   Thermal Balloon - a catheter with a balloon tip is inserted into the uterus and filled with heated fluid.  Your caregiver will talk with you about the method used in this clinic. They will also instruct you on the pros and cons of the procedure. Endometrial ablation is performed along with a procedure called operative hysteroscopy. A narrow viewing tube is inserted through the birth canal (vagina) and through the cervix into the uterus. A tiny camera attached to the viewing tube (hysteroscope) allows the uterine cavity to be shown on a TV monitor during surgery. Your uterus is filled with a harmless liquid to make the procedure easier. The lining of the uterus is then removed. The lining can also be removed with a resectoscope which allows your surgeon   to cut away the lining of the uterus under direct vision. Usually, you will be able to go home within an hour after the procedure. HOME CARE INSTRUCTIONS   Do not drive for 24 hours.   No tampons, douching or intercourse for 2 weeks or until your caregiver approves.   Rest at home for 24 to 48 hours. You may then resume normal activities unless told differently by your caregiver.   Take your temperature two times a day for 4 days, and record  it.   Take any medications your caregiver has ordered, as directed.   Use some form of contraception if you are pre-menopausal and do not want to get pregnant.  Bleeding after the procedure is normal. It varies from light spotting and mildly watery to bloody discharge for 4 to 6 weeks. You may also have mild cramping. Only take over-the-counter or prescription medicines for pain, discomfort, or fever as directed by your caregiver. Do not use aspirin, as this may aggravate bleeding. Frequent urination during the first 24 hours is normal. You will not know how effective your surgery is until at least 3 months after the surgery. SEEK IMMEDIATE MEDICAL CARE IF:   Bleeding is heavier than a normal menstrual cycle.   An oral temperature above 102 F (38.9 C) develops.   You have increasing cramps or pains not relieved with medication or develop belly (abdominal) pain which does not seem to be related to the same area of earlier cramping and pain.   You are light headed, weak or have fainting episodes.   You develop pain in the shoulder strap areas.   You have chest or leg pain.   You have abnormal vaginal discharge.   You have painful urination.  Document Released: 01/27/2004 Document Revised: 03/08/2011 Document Reviewed: 04/26/2007 [facility] Patient Information 2012 [place], [place].  Hysteroscopy Hysteroscopy is a procedure used for looking inside the womb (uterus). It may be done for many different reasons, including:  To evaluate abnormal bleeding, fibroid (benign, noncancerous) tumors, polyps, scar tissue (adhesions), and possibly cancer of the uterus.   To look for lumps (tumors) and other uterine growths.   To look for causes of why a woman cannot get pregnant (infertility), causes of recurrent loss of pregnancy (miscarriages), or a lost intrauterine device (IUD).   To perform a sterilization by blocking the fallopian tubes from inside the uterus.  A hysteroscopy should be done  right after a menstrual period to be sure you are not pregnant. LET YOUR CAREGIVER KNOW ABOUT:   Allergies.   Medicines taken, including herbs, eyedrops, over-the-counter medicines, and creams.   Use of steroids (by mouth or creams).   Previous problems with anesthetics or numbing medicines.   History of bleeding or blood problems.   History of blood clots.   Possibility of pregnancy, if this applies.   Previous surgery.   Other health problems.  RISKS AND COMPLICATIONS   Putting a hole in the uterus.   Excessive bleeding.   Infection.   Damage to the cervix.   Injury to other organs.   Allergic reaction to medicines.   Too much fluid used in the uterus for the procedure.  BEFORE THE PROCEDURE   Do not take aspirin or blood thinners for a week before the procedure, or as directed. It can cause bleeding.   Arrive at least 60 minutes before the procedure or as directed to read and sign the necessary forms.   Arrange for someone to take you home  after the procedure.   If you smoke, do not smoke for 2 weeks before the procedure.  PROCEDURE   Your caregiver may give you medicine to relax you. He or she may also give you a medicine that numbs the area around the cervix (local anesthetic) or a medicine that makes you sleep (general anesthesia).   Sometimes, a medicine is placed in the cervix the day before the procedure. This medicine makes the cervix have a larger opening (dilate). This makes it easier for the instrument to be inserted into the uterus.   A small instrument (hysteroscope) is inserted through the vagina into the uterus. This instrument is similar to a pencil-sized telescope with a light.   During the procedure, air or a liquid is put into the uterus, which allows the surgeon to see better.   Sometimes, tissue is gently scraped from inside the uterus. These tissue samples are sent to a specialist who looks at tissue samples (pathologist). The pathologist  will give a report to your caregiver. This will help your caregiver decide if further treatment is necessary. The report will also help your caregiver decide on the best treatment if the test comes back abnormal.  AFTER THE PROCEDURE   If you had a general anesthetic, you may be groggy for a couple hours after the procedure.   If you had a local anesthetic, you will be advised to rest at the surgical center or caregiver's office until you are stable and feel ready to go home.   You may have some cramping for a couple days.   You may have bleeding, which varies from light spotting for a few days to menstrual-like bleeding for up to 3 to 7 days. This is normal.   Have someone take you home.  FINDING OUT THE RESULTS OF YOUR TEST Not all test results are available during your visit. If your test results are not back during the visit, make an appointment with your caregiver to find out the results. Do not assume everything is normal if you have not heard from your caregiver or the medical facility. It is important for you to follow up on all of your test results. HOME CARE INSTRUCTIONS   Do not drive for 24 hours or as instructed.   Only take over-the-counter or prescription medicines for pain, discomfort, or fever as directed by your caregiver.   Do not take aspirin. It can cause or aggravate bleeding.   Do not drive or drink alcohol while taking pain medicine.   You may resume your usual diet.   Do not use tampons, douche, or have sexual intercourse for 2 weeks, or as advised by your caregiver.   Rest and sleep for the first 24 to 48 hours.   Take your temperature twice a day for 4 to 5 days. Write it down. Give these temperatures to your caregiver if they are abnormal (above 98.6 F or 37.0 C).   Take medicines your caregiver has ordered as directed.   Follow your caregiver's advice regarding diet, exercise, lifting, driving, and general activities.   Take showers instead of baths  for 2 weeks, or as recommended by your caregiver.   If you develop constipation:   Take a mild laxative with the advice of your caregiver.   Eat bran foods.   Drink enough water and fluids to keep your urine clear or pale yellow.   Try to have someone with you or available to you for the first 24 to 48  hours, especially if you had a general anesthetic.   Make sure you and your family understand everything about your operation and recovery.   Follow your caregiver's advice regarding follow-up appointments and Pap smears.  SEEK MEDICAL CARE IF:   You feel dizzy or lightheaded.   You feel sick to your stomach (nauseous).   You develop abnormal vaginal discharge.   You develop a rash.   You have an abnormal reaction or allergy to your medicine.   You need stronger pain medicine.  SEEK IMMEDIATE MEDICAL CARE IF:   Bleeding is heavier than a normal menstrual period or you have blood clots.   You have an oral temperature above 102 F (38.9 C), not controlled by medicine.   You have increasing cramps or pains not relieved with medicine.   You develop belly (abdominal) pain that does not seem to be related to the same area of earlier cramping and pain.   You pass out.   You develop pain in the tops of your shoulders (shoulder strap areas).   You develop shortness of breath.  MAKE SURE YOU:   Understand these instructions.   Will watch your condition.   Will get help right away if you are not doing well or get worse.  Document Released: 06/25/2000 Document Revised: 03/08/2011 Document Reviewed: 10/18/2008 [facility] Patient Information 2012 [place], [place].  Sonohysterogram

## 2011-12-14 NOTE — Addendum Note (Signed)
Addended byWinfred Leeds on: 12/14/2011 12:11 PM   Modules accepted: Orders

## 2011-12-17 LAB — PAP IG W/ RFLX HPV ASCU

## 2011-12-24 ENCOUNTER — Encounter: Payer: BC Managed Care – PPO | Admitting: Obstetrics and Gynecology

## 2012-12-08 ENCOUNTER — Ambulatory Visit (INDEPENDENT_AMBULATORY_CARE_PROVIDER_SITE_OTHER): Payer: BC Managed Care – PPO | Admitting: Surgery

## 2012-12-08 ENCOUNTER — Encounter (INDEPENDENT_AMBULATORY_CARE_PROVIDER_SITE_OTHER): Payer: Self-pay | Admitting: Surgery

## 2012-12-08 VITALS — BP 126/86 | HR 78 | Temp 98.4°F | Resp 14 | Ht 65.0 in | Wt 159.6 lb

## 2012-12-08 DIAGNOSIS — L723 Sebaceous cyst: Secondary | ICD-10-CM

## 2012-12-08 NOTE — Progress Notes (Signed)
Patient ID: [name], female   DOB: 16-Sep-1965, 47 y.o.   MRN: [id number]  Chief Complaint  Patient presents with  . New Evaluation    eval Rt breast/mass    HPI [name] is a 47 y.o. female.  Patient sent a request of Dr. Anne Hahn of [facility]. Patient has cystic lesion involving medial right breast. This is present for 2 months. Started as a small skin cyst in the medial right breast. She change bras and the underwire inflamed it became swollen and begin a drain. Mammography and ultrasound were done. This showed a 2 cm lesion in this area consistent with epidermal inclusion cyst. It is still draining. It is tender. It is less red than when it started. HPI  Past Medical History  Diagnosis Date  . 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 Fibroids 2008    Past Surgical History  Procedure Laterality Date  . Cardiac catheterization  9/12    placed 2 stints    Family History  Problem Relation Age of Onset  . Heart disease Father   . Cancer Mother 30    ovarian sarcoma  . Hypertension Brother   . Hypertension Sister     Social History History  Substance Use Topics  . Smoking status: Never Smoker   . Smokeless tobacco: Never Used  . Alcohol Use: No    Allergies  Allergen Reactions  . Erythromycin   . Lipitor [Atorvastatin] Other (See Comments)    Aches in muscles/barely can move    Current Outpatient Prescriptions  Medication Sig Dispense Refill  . aspirin 81 MG tablet Take 81 mg by mouth daily.      . calcium carbonate 200 MG capsule Take 250 mg by mouth 2 (two) times daily with a meal.      . Calcium Carbonate-Vitamin D (CALCIUM + D PO) Take by mouth.      . nystatin-triamcinolone ointment (MYCOLOG) Apply topically 2 (two) times daily.  30 g  0   No current facility-administered medications for this visit.    Review of Systems Review of Systems  Constitutional: Negative for fever, chills and unexpected weight change.  HENT: Negative for hearing loss,  congestion, sore throat, trouble swallowing and voice change.   Eyes: Negative for visual disturbance.  Respiratory: Negative for cough and wheezing.   Cardiovascular: Negative for chest pain, palpitations and leg swelling.  Gastrointestinal: Negative for nausea, vomiting, abdominal pain, diarrhea, constipation, blood in stool, abdominal distention and anal bleeding.  Genitourinary: Negative for hematuria, vaginal bleeding and difficulty urinating.  Musculoskeletal: Negative for arthralgias.  Skin: Negative for rash and wound.  Neurological: Negative for seizures, syncope and headaches.  Hematological: Negative for adenopathy. Does not bruise/bleed easily.  Psychiatric/Behavioral: Negative for confusion.    Blood pressure 126/86, pulse 78, temperature 98.4 F (36.9 C), temperature source Temporal, resp. rate 14, height 5\' 5"  (1.651 m), weight 159 lb 9.6 oz (72.394 kg).  Physical Exam Physical Exam  Constitutional: She is oriented to person, place, and time. She appears well-developed.  HENT:  Head: Normocephalic and atraumatic.  Eyes: Pupils are equal, round, and reactive to light. No scleral icterus.  Pulmonary/Chest: Right breast exhibits mass. Right breast exhibits no inverted nipple, no skin change and no tenderness. Left breast exhibits no inverted nipple, no mass, no nipple discharge, no skin change and no tenderness. Breasts are symmetrical.    Musculoskeletal: Normal range of motion.  Neurological: She is alert and oriented to person, place, and time.  Skin: Skin is warm and dry.  Psychiatric: She has a normal mood and affect. Her behavior is normal. Judgment and thought content normal.    Data Reviewed [facility] mammogram and ultrasound right breast shows 2 cm medial cystic lesion involving the skin consistent with epidermal inclusion cyst  Assessment    Epidermal inclusion cyst right breast ruptured    Plan    Recommend excision under local anesthesia with IV sedation.  May continue aspirin. Risk of bleeding, infection, recurrence discussed. She would like to proceed.The procedure has been discussed with the patient.  Alternative therapies have been discussed with the patient.  Operative risks include bleeding,  Infection,  Organ injury,  Nerve injury,  Blood vessel injury,  DVT,  Pulmonary embolism,  Death,  And possible reoperation.  Medical management risks include worsening of present situation.  The success of the procedure is 50 -90 % at treating patients symptoms.  The patient understands and agrees to proceed.       Gerica Koble A. 12/08/2012, 3:49 PM

## 2012-12-08 NOTE — Patient Instructions (Signed)

## 2012-12-10 ENCOUNTER — Encounter (HOSPITAL_BASED_OUTPATIENT_CLINIC_OR_DEPARTMENT_OTHER): Payer: Self-pay | Admitting: *Deleted

## 2012-12-12 ENCOUNTER — Encounter (HOSPITAL_BASED_OUTPATIENT_CLINIC_OR_DEPARTMENT_OTHER): Payer: Self-pay | Admitting: *Deleted

## 2012-12-12 NOTE — Progress Notes (Signed)
Pt has strong family hx CAD-had stents 2012-done well-sees dr smith-last 5/114-last stress 2012-good-to stay on asa 81 per dr Luisa Hart

## 2012-12-24 ENCOUNTER — Encounter (HOSPITAL_BASED_OUTPATIENT_CLINIC_OR_DEPARTMENT_OTHER): Payer: Self-pay | Admitting: *Deleted

## 2012-12-24 NOTE — Progress Notes (Signed)
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

## 2012-12-30 ENCOUNTER — Encounter (HOSPITAL_BASED_OUTPATIENT_CLINIC_OR_DEPARTMENT_OTHER): Payer: Self-pay | Admitting: *Deleted

## 2012-12-30 ENCOUNTER — Ambulatory Visit (HOSPITAL_BASED_OUTPATIENT_CLINIC_OR_DEPARTMENT_OTHER): Payer: BC Managed Care – PPO | Admitting: *Deleted

## 2012-12-30 ENCOUNTER — Ambulatory Visit (HOSPITAL_BASED_OUTPATIENT_CLINIC_OR_DEPARTMENT_OTHER)
Admission: RE | Admit: 2012-12-30 | Discharge: 2012-12-30 | Disposition: A | Discharge status: Home / Self Care | Payer: BC Managed Care – PPO | Source: Ambulatory Visit | Attending: Surgery | Admitting: Surgery

## 2012-12-30 ENCOUNTER — Encounter (HOSPITAL_BASED_OUTPATIENT_CLINIC_OR_DEPARTMENT_OTHER)
Admission: RE | Disposition: A | Discharge status: Home / Self Care | Payer: Self-pay | Source: Ambulatory Visit | Attending: Surgery

## 2012-12-30 DIAGNOSIS — L723 Sebaceous cyst: Secondary | ICD-10-CM

## 2012-12-30 DIAGNOSIS — N6089 Other benign mammary dysplasias of unspecified breast: Secondary | ICD-10-CM | POA: Insufficient documentation

## 2012-12-30 HISTORY — DX: Presence of spectacles and contact lenses: Z97.3

## 2012-12-30 HISTORY — PX: BREAST BIOPSY: SHX20

## 2012-12-30 HISTORY — DX: Atherosclerotic heart disease of native coronary artery without angina pectoris: I25.10

## 2012-12-30 HISTORY — DX: Family history of ischemic heart disease and other diseases of the circulatory system: Z82.49

## 2012-12-30 SURGERY — BREAST BIOPSY
Anesthesia: Monitor Anesthesia Care | Site: Breast | Laterality: Right | Wound class: Clean

## 2012-12-30 MED ORDER — LIDOCAINE HCL (CARDIAC) 20 MG/ML IV SOLN
INTRAVENOUS | Status: DC | PRN
  Administered 2012-12-30: 25 mg via INTRAVENOUS

## 2012-12-30 MED ORDER — MIDAZOLAM HCL 2 MG/2ML IJ SOLN: 1.0000 mg | INTRAMUSCULAR | Status: DC | PRN

## 2012-12-30 MED ORDER — DEXAMETHASONE SODIUM PHOSPHATE 10 MG/ML IJ SOLN
INTRAMUSCULAR | Status: DC | PRN
  Administered 2012-12-30: 4 mg via INTRAVENOUS

## 2012-12-30 MED ORDER — TRAMADOL HCL 50 MG PO TABS: 50.0000 mg | ORAL_TABLET | Freq: Four times a day (QID) | ORAL | Status: DC | PRN

## 2012-12-30 MED ORDER — OXYCODONE HCL 5 MG/5ML PO SOLN: 5.0000 mg | Freq: Once | ORAL | Status: DC | PRN

## 2012-12-30 MED ORDER — CHLORHEXIDINE GLUCONATE 4 % EX LIQD: 1.0000 "application " | Freq: Once | CUTANEOUS | Status: DC

## 2012-12-30 MED ORDER — HYDROMORPHONE HCL PF 1 MG/ML IJ SOLN: 0.2500 mg | INTRAMUSCULAR | Status: DC | PRN

## 2012-12-30 MED ORDER — PROPOFOL INFUSION 10 MG/ML OPTIME
INTRAVENOUS | Status: DC | PRN
  Administered 2012-12-30: 100 ug/kg/min via INTRAVENOUS

## 2012-12-30 MED ORDER — BUPIVACAINE-EPINEPHRINE 0.25% -1:200000 IJ SOLN
INTRAMUSCULAR | Status: DC | PRN
  Administered 2012-12-30: 20 mL

## 2012-12-30 MED ORDER — FENTANYL CITRATE 0.05 MG/ML IJ SOLN
INTRAMUSCULAR | Status: DC | PRN
  Administered 2012-12-30: 50 ug via INTRAVENOUS

## 2012-12-30 MED ORDER — FENTANYL CITRATE 0.05 MG/ML IJ SOLN: 50.0000 ug | INTRAMUSCULAR | Status: DC | PRN

## 2012-12-30 MED ORDER — DEXTROSE 5 % IV SOLN
3.0000 g | INTRAVENOUS | Status: AC
  Administered 2012-12-30: 2 g via INTRAVENOUS

## 2012-12-30 MED ORDER — LACTATED RINGERS IV SOLN
INTRAVENOUS | Status: DC
  Administered 2012-12-30: 11:00:00 via INTRAVENOUS

## 2012-12-30 MED ORDER — ONDANSETRON HCL 4 MG/2ML IJ SOLN: 4.0000 mg | Freq: Once | INTRAMUSCULAR | Status: DC | PRN

## 2012-12-30 MED ORDER — OXYCODONE HCL 5 MG PO TABS: 5.0000 mg | ORAL_TABLET | Freq: Once | ORAL | Status: DC | PRN

## 2012-12-30 MED ORDER — ONDANSETRON HCL 4 MG/2ML IJ SOLN
INTRAMUSCULAR | Status: DC | PRN
  Administered 2012-12-30: 4 mg via INTRAVENOUS

## 2012-12-30 MED ORDER — MIDAZOLAM HCL 5 MG/5ML IJ SOLN
INTRAMUSCULAR | Status: DC | PRN
  Administered 2012-12-30: 2 mg via INTRAVENOUS

## 2012-12-30 SURGICAL SUPPLY — 47 items
BINDER BREAST LRG (GAUZE/BANDAGES/DRESSINGS) IMPLANT
BINDER BREAST MEDIUM (GAUZE/BANDAGES/DRESSINGS) IMPLANT
BINDER BREAST XLRG (GAUZE/BANDAGES/DRESSINGS) IMPLANT
BINDER BREAST XXLRG (GAUZE/BANDAGES/DRESSINGS) IMPLANT
BLADE SURG 15 STRL LF DISP TIS (BLADE) ×1 IMPLANT
BLADE SURG 15 STRL SS (BLADE) ×1
CANISTER SUCTION 1200CC (MISCELLANEOUS) ×2 IMPLANT
CHLORAPREP W/TINT 26ML (MISCELLANEOUS) ×2 IMPLANT
CLIP TI WIDE RED SMALL 6 (CLIP) IMPLANT
CLOTH BEACON ORANGE TIMEOUT ST (SAFETY) IMPLANT
COVER MAYO STAND STRL (DRAPES) ×2 IMPLANT
COVER TABLE BACK 60X90 (DRAPES) ×2 IMPLANT
DECANTER SPIKE VIAL GLASS SM (MISCELLANEOUS) IMPLANT
DERMABOND ADVANCED (GAUZE/BANDAGES/DRESSINGS) ×1
DERMABOND ADVANCED .7 DNX12 (GAUZE/BANDAGES/DRESSINGS) ×1 IMPLANT
DEVICE DUBIN W/COMP PLATE 8390 (MISCELLANEOUS) IMPLANT
DRAPE LAPAROSCOPIC ABDOMINAL (DRAPES) IMPLANT
DRAPE PED LAPAROTOMY (DRAPES) ×2 IMPLANT
DRAPE UTILITY XL STRL (DRAPES) ×2 IMPLANT
ELECT COATED BLADE 2.86 ST (ELECTRODE) ×2 IMPLANT
ELECT REM PT RETURN 9FT ADLT (ELECTROSURGICAL) ×2
ELECTRODE REM PT RTRN 9FT ADLT (ELECTROSURGICAL) ×1 IMPLANT
GLOVE BIOGEL PI IND STRL 7.0 (GLOVE) ×1 IMPLANT
GLOVE BIOGEL PI IND STRL 8 (GLOVE) ×1 IMPLANT
GLOVE BIOGEL PI INDICATOR 7.0 (GLOVE) ×1
GLOVE BIOGEL PI INDICATOR 8 (GLOVE) ×1
GLOVE ECLIPSE 6.5 STRL STRAW (GLOVE) ×2 IMPLANT
GLOVE ECLIPSE 8.0 STRL XLNG CF (GLOVE) ×2 IMPLANT
GOWN PREVENTION PLUS XLARGE (GOWN DISPOSABLE) ×4 IMPLANT
KIT MARKER MARGIN INK (KITS) IMPLANT
NEEDLE HYPO 25X1 1.5 SAFETY (NEEDLE) ×2 IMPLANT
NS IRRIG 1000ML POUR BTL (IV SOLUTION) ×2 IMPLANT
PACK BASIN DAY SURGERY FS (CUSTOM PROCEDURE TRAY) ×2 IMPLANT
PENCIL BUTTON HOLSTER BLD 10FT (ELECTRODE) ×2 IMPLANT
SLEEVE SCD COMPRESS KNEE MED (MISCELLANEOUS) ×2 IMPLANT
SPONGE LAP 4X18 X RAY DECT (DISPOSABLE) ×2 IMPLANT
STAPLER VISISTAT 35W (STAPLE) IMPLANT
SUT MON AB 4-0 PC3 18 (SUTURE) ×2 IMPLANT
SUT SILK 2 0 SH (SUTURE) IMPLANT
SUT VIC AB 3-0 SH 27 (SUTURE) ×1
SUT VIC AB 3-0 SH 27X BRD (SUTURE) ×1 IMPLANT
SYR BULB 3OZ (MISCELLANEOUS) ×2 IMPLANT
SYR CONTROL 10ML LL (SYRINGE) ×2 IMPLANT
TOWEL OR 17X24 6PK STRL BLUE (TOWEL DISPOSABLE) ×2 IMPLANT
TOWEL OR NON WOVEN STRL DISP B (DISPOSABLE) ×2 IMPLANT
TUBE CONNECTING 20X1/4 (TUBING) ×2 IMPLANT
YANKAUER SUCT BULB TIP NO VENT (SUCTIONS) ×2 IMPLANT

## 2012-12-30 NOTE — H&P (View-Only) (Signed)
Patient ID: [name], female   DOB: [birth year], 47 y.o.   MRN: [id number]  Chief Complaint  Patient presents with  . New Evaluation    eval Rt breast/mass    HPI [name] is Small 47 y.o. female.  Patient sent Small request of Dr. Anne Hahn of [name]. Patient has cystic lesion involving medial right breast. This is present for 2 months. Started as Small small skin cyst in the medial right breast. She change bras and the underwire inflamed it became swollen and begin Small drain. Mammography and ultrasound were done. This showed Small 2 cm lesion in this area consistent with epidermal inclusion cyst. It is still draining. It is tender. It is less red than when it started. HPI  Past Medical History  Diagnosis Date  . 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 Fibroids 2008    Past Surgical History  Procedure Laterality Date  . Cardiac catheterization  9/12    placed 2 stints    Family History  Problem Relation Age of Onset  . Heart disease Father   . Cancer Mother 49    ovarian sarcoma  . Hypertension Brother   . Hypertension Sister     Social History History  Substance Use Topics  . Smoking status: Never Smoker   . Smokeless tobacco: Never Used  . Alcohol Use: No    Allergies  Allergen Reactions  . Erythromycin   . Lipitor [Atorvastatin] Other (See Comments)    Aches in muscles/barely can move    Current Outpatient Prescriptions  Medication Sig Dispense Refill  . aspirin 81 MG tablet Take 81 mg by mouth daily.      . calcium carbonate 200 MG capsule Take 250 mg by mouth 2 (two) times daily with Small meal.      . Calcium Carbonate-Vitamin D (CALCIUM + D PO) Take by mouth.      . nystatin-triamcinolone ointment (MYCOLOG) Apply topically 2 (two) times daily.  30 g  0   No current facility-administered medications for this visit.    Review of Systems Review of Systems  Constitutional: Negative for fever, chills and unexpected weight change.  HENT: Negative for hearing loss,  congestion, sore throat, trouble swallowing and voice change.   Eyes: Negative for visual disturbance.  Respiratory: Negative for cough and wheezing.   Cardiovascular: Negative for chest pain, palpitations and leg swelling.  Gastrointestinal: Negative for nausea, vomiting, abdominal pain, diarrhea, constipation, blood in stool, abdominal distention and anal bleeding.  Genitourinary: Negative for hematuria, vaginal bleeding and difficulty urinating.  Musculoskeletal: Negative for arthralgias.  Skin: Negative for rash and wound.  Neurological: Negative for seizures, syncope and headaches.  Hematological: Negative for adenopathy. Does not bruise/bleed easily.  Psychiatric/Behavioral: Negative for confusion.    Blood pressure 126/86, pulse 78, temperature 98.4 F (36.9 C), temperature source Temporal, resp. rate 14, height 5\' 5"  (1.651 m), weight 159 lb 9.6 oz (72.394 kg).  Physical Exam Physical Exam  Constitutional: She is oriented to person, place, and time. She appears well-developed.  HENT:  Head: Normocephalic and atraumatic.  Eyes: Pupils are equal, round, and reactive to light. No scleral icterus.  Pulmonary/Chest: Right breast exhibits mass. Right breast exhibits no inverted nipple, no skin change and no tenderness. Left breast exhibits no inverted nipple, no mass, no nipple discharge, no skin change and no tenderness. Breasts are symmetrical.    Musculoskeletal: Normal range of motion.  Neurological: She is alert and oriented to person, place, and time.  Skin: Skin is warm and dry.  Psychiatric: She has Small normal mood and affect. Her behavior is normal. Judgment and thought content normal.    Data Reviewed [facility] mammogram and ultrasound right breast shows 2 cm medial cystic lesion involving the skin consistent with epidermal inclusion cyst  Assessment    Epidermal inclusion cyst right breast ruptured    Plan    Recommend excision under local anesthesia with IV sedation.  May continue aspirin. Risk of bleeding, infection, recurrence discussed. She would like to proceed.The procedure has been discussed with the patient.  Alternative therapies have been discussed with the patient.  Operative risks include bleeding,  Infection,  Organ injury,  Nerve injury,  Blood vessel injury,  DVT,  Pulmonary embolism,  Death,  And possible reoperation.  Medical management risks include worsening of present situation.  The success of the procedure is 50 -90 % at treating patients symptoms.  The patient understands and agrees to proceed.       [name]. 12/08/2012, 3:49 PM

## 2012-12-30 NOTE — Transfer of Care (Signed)
Immediate Anesthesia Transfer of Care Note  Patient: [name]  Procedure(s) Performed: Procedure(s): CYST EXCISION BREAST (Right)  Patient Location: PACU  Anesthesia Type:MAC  Level of Consciousness: awake, alert  and oriented  Airway & Oxygen Therapy: Patient Spontanous Breathing and Patient connected to face mask oxygen  Post-op Assessment: Report given to PACU RN, Post -op Vital signs reviewed and stable and Patient moving all extremities  Post vital signs: Reviewed and stable  Complications: No apparent anesthesia complications

## 2012-12-30 NOTE — Brief Op Note (Signed)
12/30/2012  12:31 PM  PATIENT:  [name]  47 y.o. female  PRE-OPERATIVE DIAGNOSIS:  right breast cyst   POST-OPERATIVE DIAGNOSIS:  right breast cyst   PROCEDURE:  Procedure(s): CYST EXCISION BREAST (Right)  SURGEON:  Surgeon(s) and Role:    * Cynthis Purington A. Alahia Whicker, MD - Primary      ANESTHESIA:   MAC  EBL:  Total I/O In: 300 [I.V.:300] Out: -   BLOOD ADMINISTERED:none    LOCAL MEDICATIONS USED:  BUPIVICAINE   SPECIMEN:  Source of Specimen:  skin right medial breast  DISPOSITION OF SPECIMEN:  PATHOLOGY  COUNTS:  YES  TOURNIQUET:  * No tourniquets in log *  DICTATION: .Other Dictation: Dictation Number [id number]  PLAN OF CARE: Discharge to home after PACU  PATIENT DISPOSITION:  PACU - hemodynamically stable.   Delay start of Pharmacological VTE agent (>24hrs) due to surgical blood loss or risk of bleeding: not applicable

## 2012-12-30 NOTE — Anesthesia Postprocedure Evaluation (Signed)
  Anesthesia Post-op Note  Patient: [name]  Procedure(s) Performed: Procedure(s): CYST EXCISION BREAST (Right)  Patient Location: PACU  Anesthesia Type:MAC  Level of Consciousness: awake, alert  and oriented  Airway and Oxygen Therapy: Patient Spontanous Breathing  Post-op Pain: mild  Post-op Assessment: Post-op Vital signs reviewed  Post-op Vital Signs: Reviewed  Complications: No apparent anesthesia complications

## 2012-12-30 NOTE — Anesthesia Procedure Notes (Signed)
Procedure Name: MAC Date/Time: 12/30/2012 12:15 PM Performed by: Meyer Russel Pre-anesthesia Checklist: Patient identified, Emergency Drugs available, Suction available, Patient being monitored and Timeout performed Patient Re-evaluated:Patient Re-evaluated prior to inductionOxygen Delivery Method: Simple face mask Preoxygenation: Pre-oxygenation with 100% oxygen Intubation Type: IV induction Ventilation: Mask ventilation without difficulty Dental Injury: Teeth and Oropharynx as per pre-operative assessment

## 2012-12-30 NOTE — Anesthesia Preprocedure Evaluation (Signed)
Anesthesia Evaluation  Patient identified by MRN, date of birth, ID band Patient awake    Reviewed: Allergy & Precautions, H&P , NPO status , Patient's Chart, lab work & pertinent test results  Airway Mallampati: I TM Distance: >3 FB Neck ROM: Full    Dental  (+) Teeth Intact and Dental Advisory Given   Pulmonary  breath sounds clear to auscultation        Cardiovascular + CAD (2 stents iin place functioning well.) Rhythm:Regular Rate:Normal     Neuro/Psych    GI/Hepatic   Endo/Other    Renal/GU      Musculoskeletal   Abdominal   Peds  Hematology   Anesthesia Other Findings   Reproductive/Obstetrics                           Anesthesia Physical Anesthesia Plan  ASA: III  Anesthesia Plan: MAC   Post-op Pain Management:    Induction: Intravenous  Airway Management Planned: Simple Face Mask  Additional Equipment:   Intra-op Plan:   Post-operative Plan:   Informed Consent: I have reviewed the patients History and Physical, chart, labs and discussed the procedure including the risks, benefits and alternatives for the proposed anesthesia with the patient or authorized representative who has indicated his/her understanding and acceptance.   Dental advisory given  Plan Discussed with: CRNA, Anesthesiologist and Surgeon  Anesthesia Plan Comments:         Anesthesia Quick Evaluation

## 2012-12-30 NOTE — Interval H&P Note (Signed)
History and Physical Interval Note:  12/30/2012 11:50 AM  [name]  has presented today for surgery, with the diagnosis of right breast cyst   The various methods of treatment have been discussed with the patient and family. After consideration of risks, benefits and other options for treatment, the patient has consented to  Procedure(s): CYST EXCISION BREAST (Right) as a surgical intervention .  The patient's history has been reviewed, patient examined, no change in status, stable for surgery.  I have reviewed the patient's chart and labs.  Questions were answered to the patient's satisfaction.     Pasty Manninen A.

## 2012-12-31 ENCOUNTER — Encounter (HOSPITAL_BASED_OUTPATIENT_CLINIC_OR_DEPARTMENT_OTHER): Payer: Self-pay | Admitting: Surgery

## 2012-12-31 NOTE — Op Note (Signed)
[name], [name]                ACCOUNT NO.:  [id number]  MEDICAL RECORD NO.:  [id number]  LOCATION:                                 FACILITY:  PHYSICIAN:  Eldrige Pitkin A. Adelie Croswell, M.D.     DATE OF BIRTH:  DATE OF PROCEDURE:  12/30/2012 DATE OF DISCHARGE:                              OPERATIVE REPORT   PREOPERATIVE DIAGNOSIS:  Right medial breast sebaceous cyst draining.  POSTOPERATIVE DIAGNOSIS:  Right breast medial sebaceous cyst.  PROCEDURE:  Excision of right breast medial sebaceous cyst.  SURGEON:  Melanie Pellot A. Navpreet Szczygiel, M.D.  ANESTHESIA:  MAC with 0.25% Sensorcaine local.  ESTIMATE BLOOD LOSS:  Minimal.  SPECIMEN:  Skin cyst to pathology.  INDICATIONS FOR PROCEDURE:  The patient is a __________-year-old female, who has had a draining cyst in the medial right breast for some time now.  We discussed conservative management, which she has been through which includes drainage and antibiotics.  This has now resolved.  We recommended excision in the operating room.  Her infection is cleared. She presents today for excision of cyst in the medial right breast. Risk, benefits, and alternative of the therapies were discussed with the patient.  She agreed to proceed.  DESCRIPTION OF PROCEDURE:  The patient met in the holding area, and the area in her right breast was marked and questions were answered.  She was then taken back to the operating room, placed supine on the OR table.  MAC anesthesia was initiated.  Right breast was then prepped and draped in sterile fashion.  Time-out was done.  She received preoperative antibiotics.  A medial right breast incision was made approximately 1.5 cm incised around the cyst area.  This was in the medial right breast.  An ellipse of skin was taken.  This was taken down to healthy fat tissue.  No signs of infection.  The wound was irrigated, and the tissue sent to pathology.  I closed the wound with 3-0 Vicryl and 4-0 Monocryl.  Dermabond  applied.  All final counts of sponge, needle, and instruments were found to be correct at this portion of the case.  The patient awoke, extubated, and taken to recovery in satisfactory condition.     Azusena Erlandson A. Kanaya Gunnarson, M.D.    TAC/MEDQ  D:  12/30/2012  T:  12/31/2012  Job:  [id number]

## 2013-01-02 ENCOUNTER — Telehealth (INDEPENDENT_AMBULATORY_CARE_PROVIDER_SITE_OTHER): Payer: Self-pay

## 2013-01-02 NOTE — Telephone Encounter (Signed)
Called pt and gave her benign path.

## 2013-01-02 NOTE — Telephone Encounter (Signed)
Message copied by Brennan Bailey on Fri Jan 02, 2013 12:50 PM ------      Message from: Harriette Bouillon A      Created: Thu Jan 01, 2013  7:04 AM       benign ------

## 2013-01-16 ENCOUNTER — Encounter (INDEPENDENT_AMBULATORY_CARE_PROVIDER_SITE_OTHER): Payer: Self-pay | Admitting: Surgery

## 2013-01-16 ENCOUNTER — Ambulatory Visit (INDEPENDENT_AMBULATORY_CARE_PROVIDER_SITE_OTHER): Payer: BC Managed Care – PPO | Admitting: Surgery

## 2013-01-16 VITALS — BP 121/76 | HR 60 | Temp 97.4°F | Resp 14 | Ht 64.0 in | Wt 161.6 lb

## 2013-01-16 DIAGNOSIS — Z9889 Other specified postprocedural states: Secondary | ICD-10-CM

## 2013-01-16 NOTE — Patient Instructions (Signed)
Return as needed

## 2013-01-16 NOTE — Progress Notes (Signed)
Patient returns after right breast cyst excision. This was done 17 days ago. Pathology came back as a benign ruptured epidermal inclusion cyst. She's doing well.  Exam: Wounds clean dry and intact. The right breast.   Impression: Status post excision right breast cyst  Plan: Return to full activity. Return as needed.

## 2013-02-03 ENCOUNTER — Encounter: Payer: Self-pay | Admitting: Interventional Cardiology

## 2013-02-03 DIAGNOSIS — I2510 Atherosclerotic heart disease of native coronary artery without angina pectoris: Secondary | ICD-10-CM | POA: Insufficient documentation

## 2013-02-03 DIAGNOSIS — I7090 Unspecified atherosclerosis: Secondary | ICD-10-CM | POA: Insufficient documentation

## 2013-02-03 DIAGNOSIS — D219 Benign neoplasm of connective and other soft tissue, unspecified: Secondary | ICD-10-CM | POA: Insufficient documentation

## 2013-02-04 ENCOUNTER — Encounter: Payer: Self-pay | Admitting: Interventional Cardiology

## 2013-02-04 ENCOUNTER — Ambulatory Visit (INDEPENDENT_AMBULATORY_CARE_PROVIDER_SITE_OTHER): Payer: BC Managed Care – PPO | Admitting: Interventional Cardiology

## 2013-02-04 VITALS — BP 124/88 | HR 66 | Ht 65.0 in | Wt 162.0 lb

## 2013-02-04 DIAGNOSIS — I2510 Atherosclerotic heart disease of native coronary artery without angina pectoris: Secondary | ICD-10-CM

## 2013-02-04 DIAGNOSIS — E785 Hyperlipidemia, unspecified: Secondary | ICD-10-CM

## 2013-02-04 DIAGNOSIS — I10 Essential (primary) hypertension: Secondary | ICD-10-CM

## 2013-02-04 NOTE — Progress Notes (Signed)
Patient ID: [name], female   DOB: Sep 10, 1965, 47 y.o.   MRN: [id number]    [address] [place], [place]  [address] Phone: [phone] Fax:  [phone]  Date:  02/04/2013   ID:  [name], DOB 01-04-1966, MRN [id number]  PCP:  Ron Parker, MD   ASSESSMENT:  1. Coronary artery disease, asymptomatic now 2 years status post drug-eluting stent implantation 2. Hyperlipidemia not currently on therapy do to medication intolerance 3. Labile blood pressure  PLAN:  1. I encouraged an active lifestyle and weight reduction 2. She will measure for blood pressure recordings over the next 4 weeks and report them back to [place]. 3. Clinical followup in one year 4. Heart healthy diet   SUBJECTIVE: [name] is a 47 y.o. female is having no angina or cardiac complaints. He is gaining weight. 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 She is not exercising. Her medication regimen is been simplified to one aspirin per day.   Wt Readings from Last 3 Encounters:  02/04/13 162 lb (73.483 kg)  01/16/13 161 lb 9.6 oz (73.301 kg)  12/30/12 160 lb 2 oz (72.632 kg)     Past Medical History  Diagnosis Date  . 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 Fibroids 2008  . Coronary artery disease     2 stents 2012  . Family history of early CAD   . Wears glasses   . Stented coronary artery 2012    Current Outpatient Prescriptions  Medication Sig Dispense Refill  . aspirin 81 MG tablet Take 81 mg by mouth daily.      . Calcium Carbonate-Vitamin D (CALCIUM + D PO) Take by mouth.       No current facility-administered medications for this visit.    Allergies:    Allergies  Allergen Reactions  . Erythromycin Other (See Comments)    Stomach cramps  . Lipitor [Atorvastatin] Other (See Comments)    Aches in muscles/barely can move    Social History:  The patient  reports that she has never smoked. She has never used smokeless tobacco. She reports that she does not  drink alcohol or use illicit drugs.   ROS:  Please see the history of present illness.      All other systems reviewed and negative.   OBJECTIVE: VS:  BP 124/88  Pulse 66  Ht 5\' 5"  (1.651 m)  Wt 162 lb (73.483 kg)  BMI 26.96 kg/m2  SpO2 99% Well nourished, well developed, in no acute distress, appearing younger than stated age HEENT: normal Neck: JVD flat. Carotid bruit absent  Cardiac:  normal S1, S2; RRR; no murmur Lungs:  clear to auscultation bilaterally, no wheezing, rhonchi or rales Abd: soft, nontender, no hepatomegaly Ext: Edema  Absent . Pulses 2+  Skin: warm and dry Neuro:  CNs 2-12 intact, no focal abnormalities noted  EKG:  Normal       Signed, Darci Needle III, MD 02/04/2013 3:44 PM  And and in and

## 2013-02-04 NOTE — Patient Instructions (Signed)
Your physician recommends that you continue on your current medications as directed. Please refer to the Current Medication list given to you today.  Your physician wants you to follow-up in: 1 year You will receive a reminder letter in the mail two months in advance. If you don't receive a letter, please call our office to schedule the follow-up appointment.  Monitor your BP 4 times with in 1 month and call us with those reading. [phone]

## 2013-02-05 ENCOUNTER — Other Ambulatory Visit: Payer: Self-pay

## 2013-02-13 ENCOUNTER — Ambulatory Visit (INDEPENDENT_AMBULATORY_CARE_PROVIDER_SITE_OTHER): Payer: BC Managed Care – PPO

## 2013-02-13 ENCOUNTER — Encounter (INDEPENDENT_AMBULATORY_CARE_PROVIDER_SITE_OTHER): Payer: Self-pay

## 2013-02-13 NOTE — Progress Notes (Unsigned)
Patient in for nurse only, right breast suture. Patient states she felt a suture protruding thru the skin under right breast yesterday. Assessed the area no suture noted , patient felt the area and said "it was gone" I advised her that was normal for a suture to work its way out of the skin. Advised her to monitor the area call to call if any drainage, odor, temp 100.3 +  Patient verbalized understanding

## 2013-05-20 ENCOUNTER — Telehealth: Payer: Self-pay | Admitting: Interventional Cardiology

## 2013-05-20 ENCOUNTER — Emergency Department (HOSPITAL_BASED_OUTPATIENT_CLINIC_OR_DEPARTMENT_OTHER)
Admission: EM | Admit: 2013-05-20 | Discharge: 2013-05-20 | Disposition: A | Discharge status: Home / Self Care | Payer: BC Managed Care – PPO | Attending: Emergency Medicine | Admitting: Emergency Medicine

## 2013-05-20 ENCOUNTER — Other Ambulatory Visit: Payer: Self-pay

## 2013-05-20 ENCOUNTER — Emergency Department (HOSPITAL_BASED_OUTPATIENT_CLINIC_OR_DEPARTMENT_OTHER): Payer: BC Managed Care – PPO

## 2013-05-20 ENCOUNTER — Encounter (HOSPITAL_BASED_OUTPATIENT_CLINIC_OR_DEPARTMENT_OTHER): Payer: Self-pay | Admitting: Emergency Medicine

## 2013-05-20 DIAGNOSIS — R142 Eructation: Secondary | ICD-10-CM | POA: Insufficient documentation

## 2013-05-20 DIAGNOSIS — I2510 Atherosclerotic heart disease of native coronary artery without angina pectoris: Secondary | ICD-10-CM | POA: Insufficient documentation

## 2013-05-20 DIAGNOSIS — R143 Flatulence: Secondary | ICD-10-CM

## 2013-05-20 DIAGNOSIS — R079 Chest pain, unspecified: Secondary | ICD-10-CM

## 2013-05-20 DIAGNOSIS — Z9889 Other specified postprocedural states: Secondary | ICD-10-CM | POA: Insufficient documentation

## 2013-05-20 DIAGNOSIS — Z9861 Coronary angioplasty status: Secondary | ICD-10-CM | POA: Insufficient documentation

## 2013-05-20 DIAGNOSIS — R0789 Other chest pain: Secondary | ICD-10-CM | POA: Insufficient documentation

## 2013-05-20 DIAGNOSIS — R141 Gas pain: Secondary | ICD-10-CM | POA: Insufficient documentation

## 2013-05-20 DIAGNOSIS — Z8742 Personal history of other diseases of the female genital tract: Secondary | ICD-10-CM | POA: Insufficient documentation

## 2013-05-20 DIAGNOSIS — Z7982 Long term (current) use of aspirin: Secondary | ICD-10-CM | POA: Insufficient documentation

## 2013-05-20 LAB — TROPONIN I
Troponin I: <0.3 ng/mL (ref <0.30)
Troponin I: <0.3 ng/mL (ref <0.30)

## 2013-05-20 LAB — BASIC METABOLIC PANEL
BUN: 11 mg/dL (ref 6–23)
CALCIUM: 9.5 mg/dL (ref 8.4–10.5)
CHLORIDE: 102 meq/L (ref 96–112)
CO2: 26 mEq/L (ref 19–32)
CREATININE: 0.8 mg/dL (ref 0.50–1.10)
GFR calc non Af Amer: 85 mL/min — ABNORMAL LOW (ref >90)
Glucose, Bld: 107 mg/dL — ABNORMAL HIGH (ref 70–99)
Potassium: 3.9 mEq/L (ref 3.7–5.3)
Sodium: 141 mEq/L (ref 137–147)

## 2013-05-20 LAB — CBC WITH DIFFERENTIAL/PLATELET
BASOS PCT: 0 % (ref 0–1)
Basophils Absolute: 0 10*3/uL (ref 0.0–0.1)
EOS PCT: 2 % (ref 0–5)
Eosinophils Absolute: 0.1 10*3/uL (ref 0.0–0.7)
HEMATOCRIT: 38.9 % (ref 36.0–46.0)
HEMOGLOBIN: 12.4 g/dL (ref 12.0–15.0)
Lymphocytes Relative: 36 % (ref 12–46)
Lymphs Abs: 1.5 10*3/uL (ref 0.7–4.0)
MCH: 28.8 pg (ref 26.0–34.0)
MCHC: 31.9 g/dL (ref 30.0–36.0)
MCV: 90.3 fL (ref 78.0–100.0)
MONO ABS: 0.3 10*3/uL (ref 0.1–1.0)
MONOS PCT: 7 % (ref 3–12)
Neutro Abs: 2.2 10*3/uL (ref 1.7–7.7)
Neutrophils Relative %: 55 % (ref 43–77)
Platelets: 231 10*3/uL (ref 150–400)
RBC: 4.31 MIL/uL (ref 3.87–5.11)
RDW: 13.9 % (ref 11.5–15.5)
WBC: 4 10*3/uL (ref 4.0–10.5)

## 2013-05-20 MED ORDER — ASPIRIN 81 MG PO CHEW
324.0000 mg | CHEWABLE_TABLET | Freq: Once | ORAL | Status: AC
  Administered 2013-05-20: 324 mg via ORAL
  Filled 2013-05-20: qty 4

## 2013-05-20 NOTE — ED Provider Notes (Signed)
CSN: [id number]     Arrival date & time 05/20/13  1421 History   First MD Initiated Contact with Patient 05/20/13 1607     Chief Complaint  Patient presents with  . Chest Pain     (Consider location/radiation/quality/duration/timing/severity/associated sxs/prior Treatment) HPI  This is a 48 year old female with a history of coronary artery disease status post PCI with 2 stents in 2012 who presents with chest pain. Patient reports chest discomfort for the last 3-4 days. She states that it comes and goes. Last episode of chest pain was at 12 PM. She's currently chest pain-free. She states that it feels like a "discomfort." It may have in association with food and patient endorses increased belching. She denies any nausea, vomiting, shortness of breath, abdominal pain. She's not taken her aspirin today.  Patient states that she called her cardiologist's office and then referred her to the ED for evaluation. She states that this is not similar to when she had her stents placed.  Past Medical History  Diagnosis Date  . 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 Fibroids 2008  . Coronary artery disease     2 stents 2012  . Family history of early CAD   . Wears glasses   . Stented coronary artery 2012   Past Surgical History  Procedure Laterality Date  . Cardiac catheterization  9/12    placed 2 stints  . Breast biopsy Right 12/30/2012    Procedure: CYST EXCISION BREAST;  Surgeon: Marcello Moores A. Cornett, MD;  Location: [facility];  Service: General;  Laterality: Right;   Family History  Problem Relation Age of Onset  . Heart disease Father   . Cancer Mother 37    ovarian sarcoma  . Hypertension Brother   . Hypertension Sister    History  Substance Use Topics  . Smoking status: Never Smoker   . Smokeless tobacco: Never Used  . Alcohol Use: No   OB History   Grav Para Term Preterm Abortions TAB SAB Ect Mult Living   3 2 2  1  1   2      Review of Systems  Constitutional: Negative for  fever.  Respiratory: Positive for chest tightness. Negative for cough and shortness of breath.   Cardiovascular: Negative for chest pain.  Gastrointestinal: Negative for nausea, vomiting, abdominal pain, diarrhea and constipation.       Belching  Genitourinary: Negative for dysuria.  Musculoskeletal: Negative for back pain.  Skin: Negative for wound.  Neurological: Negative for headaches.  Psychiatric/Behavioral: Negative for confusion.  All other systems reviewed and are negative.      Allergies  Erythromycin and Lipitor  Home Medications   Current Outpatient Rx  Name  Route  Sig  Dispense  Refill  . aspirin 81 MG tablet   Oral   Take 81 mg by mouth daily.         . Calcium Carbonate-Vitamin D (CALCIUM + D PO)   Oral   Take by mouth.          BP 144/80  Pulse 75  Temp(Src) 97.9 F (36.6 C)  Resp 16  Ht 5\' 5"  (1.651 m)  Wt 160 lb (72.576 kg)  BMI 26.63 kg/m2  SpO2 100%  LMP 05/17/2013 Physical Exam  Nursing note and vitals reviewed. Constitutional: She is oriented to person, place, and time. She appears well-developed and well-nourished. No distress.  Pleasant  HENT:  Head: Normocephalic and atraumatic.  Eyes: Pupils are equal, round, and reactive to light.  Neck: Neck supple.  Cardiovascular: Normal rate, regular rhythm and normal heart sounds.   Pulmonary/Chest: Effort normal. No respiratory distress. She has no wheezes. She exhibits no tenderness.  Abdominal: Soft. Bowel sounds are normal. There is no tenderness.  Musculoskeletal: She exhibits no edema.  Neurological: She is alert and oriented to person, place, and time.  Skin: Skin is warm and dry.  Psychiatric: She has a normal mood and affect.    ED Course  Procedures (including critical care time) Labs Review Labs Reviewed  BASIC METABOLIC PANEL - Abnormal; Notable for the following:    Glucose, Bld 107 (*)    GFR calc non Af Amer 85 (*)    All other components within normal limits  CBC  WITH DIFFERENTIAL  TROPONIN I  TROPONIN I   Imaging Review Dg Chest 2 View  05/20/2013   CLINICAL DATA:  Chest pain.  History of coronary artery disease.  EXAM: CHEST - 2 VIEW  COMPARISON:  DG CHEST 2 VIEW dated 12/26/2010  FINDINGS: The heart size and mediastinal contours are within normal limits. There is no evidence of pulmonary edema, consolidation, pneumothorax, nodule or pleural fluid. The visualized skeletal structures are unremarkable.  IMPRESSION: No active disease.   Electronically Signed   By: Aletta Edouard M.D.   On: 05/20/2013 17:25    EKG Interpretation   None      Independently reviewed by myself: Normal sinus rhythm with a rate of 66, no ST elevation or ischemia noted, no change from prior MDM   Final diagnoses:  Chest pain    This 48 year old female with history of coronary artery disease who presents with 4 days of chest pain. She is nontoxic-appearing on exam. She's currently chest pain-free. Patient's EKG is unremarkable.  Chest x-ray and troponin are negative. Patient's story is somewhat atypical for ACS and is not consistent with her prior pain associated with stent placement. Discussed patient with Dr. Radford Pax who is on call for Dr. Tamala Julian. Will do a delta troponin. If negative, patient will be discharged home with close followup with Dr. Tamala Julian. Delta troponin is negative. Patient has remained chest pain-free. She was given strict return precautions.  After history, exam, and medical workup I feel the patient has been appropriately medically screened and is safe for discharge home. Pertinent diagnoses were discussed with the patient. Patient was given return precautions.     Merryl Hacker, MD 05/20/13 [phone]

## 2013-05-20 NOTE — ED Notes (Signed)
Pt c/o " chest discomfort"  X 4 days HX cardiac stents

## 2013-05-20 NOTE — ED Notes (Signed)
Attempted IV access without success.

## 2013-05-20 NOTE — ED Notes (Signed)
EDP speaking with cardiology.

## 2013-05-20 NOTE — Discharge Instructions (Signed)
Chest Pain (Nonspecific) °It is often hard to give a specific diagnosis for the cause of chest pain. There is always a chance that your pain could be related to something serious, such as a heart attack or a blood clot in the lungs. You need to follow up with your caregiver for further evaluation. °CAUSES  °· Heartburn. °· Pneumonia or bronchitis. °· Anxiety or stress. °· Inflammation around your heart (pericarditis) or lung (pleuritis or pleurisy). °· A blood clot in the lung. °· A collapsed lung (pneumothorax). It can develop suddenly on its own (spontaneous pneumothorax) or from injury (trauma) to the chest. °· Shingles infection (herpes zoster virus). °The chest wall is composed of bones, muscles, and cartilage. Any of these can be the source of the pain. °· The bones can be bruised by injury. °· The muscles or cartilage can be strained by coughing or overwork. °· The cartilage can be affected by inflammation and become sore (costochondritis). °DIAGNOSIS  °Lab tests or other studies, such as X-rays, electrocardiography, stress testing, or cardiac imaging, may be needed to find the cause of your pain.  °TREATMENT  °· Treatment depends on what may be causing your chest pain. Treatment may include: °· Acid blockers for heartburn. °· Anti-inflammatory medicine. °· Pain medicine for inflammatory conditions. °· Antibiotics if an infection is present. °· You may be advised to change lifestyle habits. This includes stopping smoking and avoiding alcohol, caffeine, and chocolate. °· You may be advised to keep your head raised (elevated) when sleeping. This reduces the chance of acid going backward from your stomach into your esophagus. °· Most of the time, nonspecific chest pain will improve within 2 to 3 days with rest and mild pain medicine. °HOME CARE INSTRUCTIONS  °· If antibiotics were prescribed, take your antibiotics as directed. Finish them even if you start to feel better. °· For the next few days, avoid physical  activities that bring on chest pain. Continue physical activities as directed. °· Do not smoke. °· Avoid drinking alcohol. °· Only take over-the-counter or prescription medicine for pain, discomfort, or fever as directed by your caregiver. °· Follow your caregiver's suggestions for further testing if your chest pain does not go away. °· Keep any follow-up appointments you made. If you do not go to an appointment, you could develop lasting (chronic) problems with pain. If there is any problem keeping an appointment, you must call to reschedule. °SEEK MEDICAL CARE IF:  °· You think you are having problems from the medicine you are taking. Read your medicine instructions carefully. °· Your chest pain does not go away, even after treatment. °· You develop a rash with blisters on your chest. °SEEK IMMEDIATE MEDICAL CARE IF:  °· You have increased chest pain or pain that spreads to your arm, neck, jaw, back, or abdomen. °· You develop shortness of breath, an increasing cough, or you are coughing up blood. °· You have severe back or abdominal pain, feel nauseous, or vomit. °· You develop severe weakness, fainting, or chills. °· You have a fever. °THIS IS AN EMERGENCY. Do not wait to see if the pain will go away. Get medical help at once. Call your local emergency services (911 in [place] Do not drive yourself to the hospital. °MAKE SURE YOU:  °· Understand these instructions. °· Will watch your condition. °· Will get help right away if you are not doing well or get worse. °Document Released: 12/27/2004 Document Revised: 06/11/2011 Document Reviewed: 10/23/2007 °ExitCare® Patient Information ©2014 [employer]   [employer]. ° °

## 2013-05-20 NOTE — Telephone Encounter (Signed)
Noted  

## 2013-05-20 NOTE — Telephone Encounter (Signed)
Pt tells me that she has had chest discomfort on and off for the past week. She states she just doesn't feel quite right. 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 She has been belching and states pain has been on and off all day today.  Pt advised to go to ED for further workup, r/o MI. Patient verbalized understanding and agreeable to plan.  I will forward to Dr. Tamala Julian and his assistant for their information/ follow up.

## 2013-05-20 NOTE — ED Notes (Signed)
Patient transported to X-ray 

## 2013-05-20 NOTE — Telephone Encounter (Signed)
New problem    Pt called having some chest discomfort for about a week now .    Please give her a call back.

## 2013-06-16 ENCOUNTER — Telehealth: Payer: Self-pay | Admitting: *Deleted

## 2013-06-16 MED ORDER — NITROGLYCERIN 0.4 MG SL SUBL: 0.4000 mg | SUBLINGUAL_TABLET | SUBLINGUAL | Status: AC | PRN

## 2013-06-16 NOTE — Telephone Encounter (Signed)
[facility] on [address] requests refill on nitro, but I do not see this on the patients med list. Ok to refill? Please advise. Thanks, MI

## 2013-06-18 ENCOUNTER — Other Ambulatory Visit: Payer: Self-pay

## 2013-07-13 ENCOUNTER — Ambulatory Visit (INDEPENDENT_AMBULATORY_CARE_PROVIDER_SITE_OTHER): Payer: BC Managed Care – PPO | Admitting: Interventional Cardiology

## 2013-07-13 ENCOUNTER — Encounter: Payer: Self-pay | Admitting: Interventional Cardiology

## 2013-07-13 VITALS — BP 124/80 | HR 68 | Ht 65.0 in | Wt 161.8 lb

## 2013-07-13 DIAGNOSIS — I2510 Atherosclerotic heart disease of native coronary artery without angina pectoris: Secondary | ICD-10-CM

## 2013-07-13 DIAGNOSIS — E785 Hyperlipidemia, unspecified: Secondary | ICD-10-CM

## 2013-07-13 DIAGNOSIS — I10 Essential (primary) hypertension: Secondary | ICD-10-CM

## 2013-07-13 NOTE — Patient Instructions (Signed)
Your physician recommends that you return for a FASTING lipid profile on  Your physician recommends that you continue on your current medications as directed. Please refer to the Current Medication list given to you today.  Your physician wants you to follow-up in: 1 year with Dr. Tamala Julian. You will receive a reminder letter in the mail two months in advance. If you don't receive a letter, please call our office to schedule the follow-up appointment.

## 2013-07-13 NOTE — Progress Notes (Signed)
Patient ID: [name], female   DOB: 12-21-1965, 48 y.o.   MRN: [id number]    [address], [address] [place], [place]  [address] Phone: [phone] Fax:  [phone]  Date:  07/13/2013   ID:  [name], DOB March 22, 1966, MRN [id number]  PCP:  Theressa Millard, MD   ASSESSMENT:  1. Coronary artery disease, asymptomatic for angina. 2. Hyperlipidemia, untreated 3. Hypertension, controlled on today's visit 4. Statin intolerance, significant musculoskeletal syndrome  PLAN:  1. Diet and exercise 2. Report anginal/chest complaints 3. Return visit in one year 4. Referral to lipid clinic if LDL cholesterol is significantly above  70   SUBJECTIVE: [name] is a 48 y.o. female who had coronary stent implantation x2 in 2012. 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 She has had no coronary symptoms. She is intolerant of statin therapy (Crestor). She has lost some weight and is exercising. Overall she feels well.   Wt Readings from Last 3 Encounters:  07/13/13 161 lb 12.8 oz (73.392 kg)  05/20/13 160 lb (72.576 kg)  02/04/13 162 lb (73.483 kg)     Past Medical History  Diagnosis Date  . 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 Fibroids 2008  . Coronary artery disease     2 stents 2012  . Family history of early CAD   . Wears glasses   . Stented coronary artery 2012    Current Outpatient Prescriptions  Medication Sig Dispense Refill  . aspirin 81 MG tablet Take 81 mg by mouth daily.      . Calcium Carbonate-Vitamin D (CALCIUM + D PO) Take by mouth.      Marland Kitchen lisinopril (PRINIVIL,ZESTRIL) 2.5 MG tablet Take 2.5 mg by mouth daily.      . nitroGLYCERIN (NITROSTAT) 0.4 MG SL tablet Place 1 tablet (0.4 mg total) under the tongue every 5 (five) minutes as needed for chest pain.  25 tablet  3   No current facility-administered medications for this visit.    Allergies:    Allergies  Allergen Reactions  . Erythromycin Other (See Comments)   Stomach cramps  . Lipitor [Atorvastatin] Other (See Comments)    Aches in muscles/barely can move    Social History:  The patient  reports that she has never smoked. She has never used smokeless tobacco. She reports that she does not drink alcohol or use illicit drugs.   ROS:  Please see the history of present illness.    All other systems reviewed and negative.   OBJECTIVE: VS:  BP 124/80  Pulse 68  Ht 5\' 5"  (1.651 m)  Wt 161 lb 12.8 oz (73.392 kg)  BMI 26.92 kg/m2 Well nourished, well developed, in no acute distress, a patient of the stated age 47: normal Neck: JVD flat. Carotid bruit absent  Cardiac:  normal S1, S2; RRR; no murmur Lungs:  clear to auscultation bilaterally, no wheezing, rhonchi or rales Abd: soft, nontender, no hepatomegaly Ext: Edema absent. Pulses 2+ bilateral Skin: warm and dry Neuro:  CNs 2-12 intact, no focal abnormalities noted  EKG:  Not repeated       Signed, Illene Labrador III, MD 07/13/2013 8:52 AM

## 2013-07-15 ENCOUNTER — Other Ambulatory Visit (INDEPENDENT_AMBULATORY_CARE_PROVIDER_SITE_OTHER): Payer: BC Managed Care – PPO

## 2013-07-15 DIAGNOSIS — E785 Hyperlipidemia, unspecified: Secondary | ICD-10-CM

## 2013-07-15 LAB — LIPID PANEL
Cholesterol: 162 mg/dL (ref 0–200)
HDL: 59.5 mg/dL (ref >39.00)
LDL CALC: 94 mg/dL (ref 0–99)
TRIGLYCERIDES: 44 mg/dL (ref 0.0–149.0)
Total CHOL/HDL Ratio: 3
VLDL: 8.8 mg/dL (ref 0.0–40.0)

## 2013-07-23 ENCOUNTER — Telehealth: Payer: Self-pay

## 2013-07-23 DIAGNOSIS — Z79899 Other long term (current) drug therapy: Secondary | ICD-10-CM

## 2013-07-23 DIAGNOSIS — E785 Hyperlipidemia, unspecified: Secondary | ICD-10-CM

## 2013-07-23 NOTE — Telephone Encounter (Signed)
Left message for patient to call back.  Need to get set up in lipid clinic.  Appears had a PCI 01/2011 and unable to tolerate statins.  I see she failed lipitor 10 mg qd (~ 04/2012) due to muscle aches and LFT elevations.  Need to find out which other statins failed, when, and why if possible.  May be candidate for SPIRE or PCSK-9 inhibitor when they hit the market.  Will await her call back to set up appointment.

## 2013-07-23 NOTE — Telephone Encounter (Signed)
pt aware of lab results and Dr.Smith recommendations.Near target but still needs lipid clinic and referral for possible trial agent. Target LDL is 70.will route message to [facility] to contact pt to set up in the lipid clinic.pt agreeable with plan and verbalized understanding.

## 2013-07-23 NOTE — Telephone Encounter (Signed)
Message copied by Lamar Laundry on Thu Jul 23, 2013  3:09 PM ------      Message from: Daneen Schick      Created: Tue Jul 21, 2013  6:42 PM       Near target but still needs lipid clinic and referral for possible trial agent. Target LDL is 70 ------

## 2013-07-23 NOTE — Telephone Encounter (Signed)
called to give pt lab results.lmom for pt to call back. 

## 2013-07-27 MED ORDER — ROSUVASTATIN CALCIUM 5 MG PO TABS
5.0000 mg | ORAL_TABLET | Freq: Every day | ORAL | Status: DC
Start: 2013-07-27 — End: 2013-09-17

## 2013-07-27 NOTE — Telephone Encounter (Signed)
Thx

## 2013-07-27 NOTE — Telephone Encounter (Signed)
Patient tells me that lipitor is the only statin she has tried so far, therefore would not qualify for lipid lowering study at this time.  If she fails 2 or more statins, she would qualify for PCSK-9 therapy.  Given pci in 2012, will have patient start low does Crestor 5 mg qd and recheck lipid / liver 5-6 weeks later.  Lab set up for 09/08/13, and appointment made to see me 09/10/13.  Labwork from PCP showed AST 41 (12/2010), 65 (05/2012), and 45 (07/2012).  She seems to have slightly elevated ALT and AST at baseline.  Will re-assess this in 08/2013 on low dose Crestor.    To Dr. Tamala Julian as FYI only.

## 2013-09-08 ENCOUNTER — Other Ambulatory Visit: Payer: BC Managed Care – PPO

## 2013-09-08 ENCOUNTER — Other Ambulatory Visit (INDEPENDENT_AMBULATORY_CARE_PROVIDER_SITE_OTHER): Payer: BC Managed Care – PPO

## 2013-09-08 DIAGNOSIS — E785 Hyperlipidemia, unspecified: Secondary | ICD-10-CM

## 2013-09-08 DIAGNOSIS — Z79899 Other long term (current) drug therapy: Secondary | ICD-10-CM

## 2013-09-08 LAB — LIPID PANEL
Cholesterol: 148 mg/dL (ref 0–200)
HDL: 54.5 mg/dL (ref >39.00)
LDL Cholesterol: 86 mg/dL (ref 0–99)
NONHDL: 93.5
Total CHOL/HDL Ratio: 3
Triglycerides: 36 mg/dL (ref 0.0–149.0)
VLDL: 7.2 mg/dL (ref 0.0–40.0)

## 2013-09-08 LAB — HEPATIC FUNCTION PANEL
ALK PHOS: 62 U/L (ref 39–117)
ALT: 14 U/L (ref 0–35)
AST: 36 U/L (ref 0–37)
Albumin: 3.9 g/dL (ref 3.5–5.2)
Bilirubin, Direct: 0 mg/dL (ref 0.0–0.3)
Total Bilirubin: 0.5 mg/dL (ref 0.2–1.2)
Total Protein: 7.1 g/dL (ref 6.0–8.3)

## 2013-09-10 ENCOUNTER — Ambulatory Visit: Payer: BC Managed Care – PPO | Admitting: Pharmacist

## 2013-09-15 ENCOUNTER — Other Ambulatory Visit: Payer: BC Managed Care – PPO

## 2013-09-17 ENCOUNTER — Ambulatory Visit (INDEPENDENT_AMBULATORY_CARE_PROVIDER_SITE_OTHER): Payer: BC Managed Care – PPO | Admitting: Pharmacist

## 2013-09-17 VITALS — Wt 159.0 lb

## 2013-09-17 DIAGNOSIS — E785 Hyperlipidemia, unspecified: Secondary | ICD-10-CM

## 2013-09-17 NOTE — Progress Notes (Signed)
Patient referred to lipid clinic by Dr. Tamala Julian given CAD (PCI in 2012) and inability to take statin.  Patient's PCI was 3 years ago, and appears to have an LDL of ~ 110 mg/dL at time of PCI.  Would like to see an LDL reduction of 50% (thus < 55 mg/dL) given CAD at young age and a low LDL.  Patient has failed lipitor 10 mg (04/2012) due to severe muscle aches, and most recently failed Crestor 5 mg qd (07/2013) due to severe muscle aches as well.  The side effects from Crestor started after taking it for just 3-4 days, and resolved a week or so after stopping it.  Patient's father had an MI in his 71's.  Patient's brother and sister don't have a h/o of premature CAD.  Dr. Tamala Julian wanted [place] to discuss the possibility of PCSK-9 inhibitor, and if she would be eligible for this medication.  LDL was 86 mg/dL 08/2013 off statin, and was only slightly over 100 mg/dL it seems at time of PCI.  Would like to get a 50% LDL reduction, and she does appear to meet criteria for SPIRE-I.    RF:  CAD (PCI 2012), family h/o CAD - LDL goal at least < 70, and prefer < 55 mg/dL (for 50% reduction) Meds:  Fish oil 1 g/d Intolerant:  Lipitor 10 mg qd, Crestor 5 mg qd (both caused severe muscle aches nearly preventing her from walking).  Labs 08/2013:  TC 148, TG 36, HDL 54, LDL 86, LFTs normal (not on LDL lowering therapy)  Current Outpatient Prescriptions  Medication Sig Dispense Refill  . aspirin 81 MG tablet Take 81 mg by mouth daily.      . Calcium Carbonate-Vitamin D (CALCIUM + D PO) Take by mouth.      Marland Kitchen lisinopril (PRINIVIL,ZESTRIL) 2.5 MG tablet Take 2.5 mg by mouth daily.      . nitroGLYCERIN (NITROSTAT) 0.4 MG SL tablet Place 1 tablet (0.4 mg total) under the tongue every 5 (five) minutes as needed for chest pain.  25 tablet  3   No current facility-administered medications for this visit.   Allergies  Allergen Reactions  . Crestor [Rosuvastatin]     Severe muscle aches on Crestor 5 mg once daily  . Erythromycin  Other (See Comments)    Stomach cramps  . Lipitor [Atorvastatin] Other (See Comments)    Aches in muscles/barely can move   Family History  Problem Relation Age of Onset  . Heart disease Father   . Cancer Mother 41    ovarian sarcoma  . Hypertension Brother   . Hypertension Sister

## 2013-09-17 NOTE — Assessment & Plan Note (Signed)
Patient and I had a long discussion about statin use vs PCSK-9 inhibitor use.  Statins appear to affect her muscles significantly, and really prevent her from performing her ADLs, so she wishes to avoid these moving forward, which I think is very reasonable.  We discussed PCSK-9 inhibitors, and not sure if she will meet the indication for use when they hit the market since her LDL was lower at time of event.  I'm curious if there is an elevated Lp(a) or inflammatory marker that could have contributed to her CAD.  Patient would like to try and enroll into SPIRE clinical trial to receive bococizumab.  I will pass her name and information to [facility] and they will call her for screening.  Patient would like to wait until early 10/2013 before starting the medication as she is about to taking some exams late July, and want to make sure no "memory fog" issues potentially occur.  I explained to her the other options we have are Zetia or BAS, but don't have the monotherapy data at this time.  She wishes to proceed with SPIRE.

## 2013-09-18 ENCOUNTER — Telehealth: Payer: Self-pay

## 2013-09-18 DIAGNOSIS — E785 Hyperlipidemia, unspecified: Secondary | ICD-10-CM

## 2013-09-18 NOTE — Telephone Encounter (Signed)
Message copied by Lamar Laundry on Fri Sep 18, 2013  9:36 AM ------      Message from: Daneen Schick      Created: Wed Sep 09, 2013 12:50 PM       Lipids are very good. Keep it up. Recheck in 8-12 months ------

## 2013-10-18 ENCOUNTER — Emergency Department (HOSPITAL_BASED_OUTPATIENT_CLINIC_OR_DEPARTMENT_OTHER): Payer: BC Managed Care – PPO

## 2013-10-18 ENCOUNTER — Encounter (HOSPITAL_BASED_OUTPATIENT_CLINIC_OR_DEPARTMENT_OTHER): Payer: Self-pay | Admitting: Emergency Medicine

## 2013-10-18 ENCOUNTER — Emergency Department (HOSPITAL_BASED_OUTPATIENT_CLINIC_OR_DEPARTMENT_OTHER)
Admission: EM | Admit: 2013-10-18 | Discharge: 2013-10-19 | Disposition: A | Discharge status: Home / Self Care | Payer: BC Managed Care – PPO | Attending: Emergency Medicine | Admitting: Emergency Medicine

## 2013-10-18 DIAGNOSIS — Z9861 Coronary angioplasty status: Secondary | ICD-10-CM | POA: Insufficient documentation

## 2013-10-18 DIAGNOSIS — Z79899 Other long term (current) drug therapy: Secondary | ICD-10-CM | POA: Insufficient documentation

## 2013-10-18 DIAGNOSIS — Z7982 Long term (current) use of aspirin: Secondary | ICD-10-CM | POA: Insufficient documentation

## 2013-10-18 DIAGNOSIS — I2510 Atherosclerotic heart disease of native coronary artery without angina pectoris: Secondary | ICD-10-CM | POA: Insufficient documentation

## 2013-10-18 DIAGNOSIS — Z9889 Other specified postprocedural states: Secondary | ICD-10-CM | POA: Insufficient documentation

## 2013-10-18 DIAGNOSIS — Z87448 Personal history of other diseases of urinary system: Secondary | ICD-10-CM | POA: Insufficient documentation

## 2013-10-18 DIAGNOSIS — R079 Chest pain, unspecified: Secondary | ICD-10-CM

## 2013-10-18 LAB — BASIC METABOLIC PANEL
ANION GAP: 14 (ref 5–15)
BUN: 11 mg/dL (ref 6–23)
CALCIUM: 9.1 mg/dL (ref 8.4–10.5)
CHLORIDE: 104 meq/L (ref 96–112)
CO2: 23 mEq/L (ref 19–32)
Creatinine, Ser: 0.8 mg/dL (ref 0.50–1.10)
GFR calc Af Amer: >90 mL/min (ref >90)
GFR calc non Af Amer: 85 mL/min — ABNORMAL LOW (ref >90)
Glucose, Bld: 102 mg/dL — ABNORMAL HIGH (ref 70–99)
Potassium: 3.9 mEq/L (ref 3.7–5.3)
SODIUM: 141 meq/L (ref 137–147)

## 2013-10-18 LAB — CBC WITH DIFFERENTIAL/PLATELET
BAND NEUTROPHILS: 0 % (ref 0–10)
Basophils Absolute: 0 10*3/uL (ref 0.0–0.1)
Basophils Relative: 1 % (ref 0–1)
Blasts: 0 %
Eosinophils Absolute: 0.1 10*3/uL (ref 0.0–0.7)
Eosinophils Relative: 2 % (ref 0–5)
HCT: 38.5 % (ref 36.0–46.0)
Hemoglobin: 12.3 g/dL (ref 12.0–15.0)
Lymphocytes Relative: 31 % (ref 12–46)
Lymphs Abs: 1.2 10*3/uL (ref 0.7–4.0)
MCH: 28.4 pg (ref 26.0–34.0)
MCHC: 31.9 g/dL (ref 30.0–36.0)
MCV: 88.9 fL (ref 78.0–100.0)
METAMYELOCYTES PCT: 0 %
MONO ABS: 0.1 10*3/uL (ref 0.1–1.0)
Monocytes Relative: 2 % — ABNORMAL LOW (ref 3–12)
Myelocytes: 0 %
Neutro Abs: 2.4 10*3/uL (ref 1.7–7.7)
Neutrophils Relative %: 64 % (ref 43–77)
PROMYELOCYTES ABS: 0 %
Platelets: 160 10*3/uL (ref 150–400)
RBC: 4.33 MIL/uL (ref 3.87–5.11)
RDW: 15.8 % — ABNORMAL HIGH (ref 11.5–15.5)
WBC: 3.8 10*3/uL — ABNORMAL LOW (ref 4.0–10.5)
nRBC: 0 /100 WBC

## 2013-10-18 LAB — TROPONIN I: Troponin I: <0.3 ng/mL (ref <0.30)

## 2013-10-18 NOTE — ED Provider Notes (Signed)
CSN: [id number]     Arrival date & time 10/18/13  1719 History  This chart was scribed for Babette Relic, MD by Lowella Petties, ED Scribe. The patient was seen in room MH05/MH05. Patient's care was started at 6:00 PM.     Chief Complaint  Patient presents with  . Chest Pain    The history is provided by the patient. No language interpreter was used.   HPI Comments: [name] is a 48 y.o. female with history of Coronary artery disease who presents to the Emergency Department complaining of a spell with a dull, mild, aching discomfort in left upper arm, shoulder, and jaw onset earlier 1 hour ago lasting 5 minutes. She reports taking aspirin today. She reports a history of stent placement.  She denies syncope, abdominal pain, and back pain.  No history of recent exertional symptoms. She was seen 1 month ago with [facility] cardiology and was doing well.  Past Medical History  Diagnosis Date  . 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 Fibroids 2008  . Coronary artery disease     2 stents 2012  . Family history of early CAD   . Wears glasses   . Stented coronary artery 2012   Past Surgical History  Procedure Laterality Date  . Cardiac catheterization  9/12    placed 2 stints  . Breast biopsy Right 12/30/2012    Procedure: CYST EXCISION BREAST;  Surgeon: Marcello Moores A. Cornett, MD;  Location: [facility];  Service: General;  Laterality: Right;   Family History  Problem Relation Age of Onset  . Heart disease Father   . Cancer Mother 32    ovarian sarcoma  . Hypertension Brother   . Hypertension Sister    History  Substance Use Topics  . Smoking status: Never Smoker   . Smokeless tobacco: Never Used  . Alcohol Use: No   OB History   Grav Para Term Preterm Abortions TAB SAB Ect Mult Living   3 2 2  1  1   2      Review of Systems  10 Systems reviewed and are negative for acute change except as noted in the HPI.   Allergies  Crestor; Erythromycin; and Lipitor  Home Medications    Prior to Admission medications   Medication Sig Start Date End Date Taking? Authorizing Provider  aspirin 81 MG tablet Take 81 mg by mouth daily.    Historical Provider, MD  Calcium Carbonate-Vitamin D (CALCIUM + D PO) Take by mouth.    Historical Provider, MD  lisinopril (PRINIVIL,ZESTRIL) 2.5 MG tablet Take 2.5 mg by mouth daily.    Historical Provider, MD  nitroGLYCERIN (NITROSTAT) 0.4 MG SL tablet Place 1 tablet (0.4 mg total) under the tongue every 5 (five) minutes as needed for chest pain. 06/16/13   Belva Crome III, MD  Omega-3 Fatty Acids (FISH OIL) 1000 MG CAPS Take 1 capsule by mouth daily.    Historical Provider, MD   Triage Vitals: BP 186/94  Pulse 79  Temp(Src) 98.4 F (36.9 C) (Oral)  Resp 22  Ht 5\' 5"  (1.651 m)  Wt 158 lb (71.668 kg)  BMI 26.29 kg/m2  SpO2 100%  LMP 09/21/2013 Physical Exam  Nursing note and vitals reviewed. Constitutional:  Awake, alert, nontoxic appearance.  HENT:  Head: Atraumatic.  Eyes: Right eye exhibits no discharge. Left eye exhibits no discharge.  Neck: Neck supple.  Cardiovascular: Normal rate and regular rhythm.   No murmur heard. Pulmonary/Chest: Effort normal  and breath sounds normal. No respiratory distress. She has no wheezes. She has no rales. She exhibits no tenderness.  Abdominal: Soft. There is no tenderness. There is no rebound.  Musculoskeletal: She exhibits no edema and no tenderness.  Baseline ROM, no obvious new focal weakness.  Neurological:  Mental status and motor strength appears baseline for patient and situation.  Skin: No rash noted.  Psychiatric: She has a normal mood and affect.    ED Course  Procedures (including critical care time) DIAGNOSTIC STUDIES: Oxygen Saturation is 100% on room air, normal by my interpretation.    COORDINATION OF CARE: Patient understand and agree with initial ED impression and plan with expectations set for ED visit.   Patient informed of clinical course, understand medical  decision-making process, and agree with plan.  D/w Cards for OutPt f/u.  Labs Review Labs Reviewed  CBC WITH DIFFERENTIAL - Abnormal; Notable for the following:    WBC 3.8 (*)    RDW 15.8 (*)    Monocytes Relative 2 (*)    All other components within normal limits  BASIC METABOLIC PANEL - Abnormal; Notable for the following:    Glucose, Bld 102 (*)    GFR calc non Af Amer 85 (*)    All other components within normal limits  TROPONIN I  TROPONIN I  TROPONIN I    Imaging Review Dg Chest 2 View  10/18/2013   CLINICAL DATA:  Left shoulder and jaw pain.  EXAM: CHEST  2 VIEW  COMPARISON:  05/20/2013.  FINDINGS: Trachea is midline. Heart size normal. Lungs are clear. No pleural fluid. Visualized portion of the upper abdomen is unremarkable.  IMPRESSION: Negative.   Electronically Signed   By: Lorin Picket M.D.   On: 10/18/2013 18:38     EKG Interpretation   Date/Time:  Sunday October 18 2013 17:24:45 EDT Ventricular Rate:  80 PR Interval:  126 QRS Duration: 78 QT Interval:  392 QTC Calculation: 452 R Axis:   39 Text Interpretation:  Normal sinus rhythm Normal ECG No significant change  since last tracing Confirmed by [facility]  MD, Jenny Reichmann ([id number]) on 10/18/2013  5:54:26 PM      MDM   Final diagnoses:  Chest pain, unspecified chest pain type   I personally performed the services described in this documentation, which was scribed in my presence. The recorded information has been reviewed and is accurate.  I doubt any other EMC precluding discharge at this time including, but not necessarily limited to the following:AMI.   Babette Relic, MD 10/19/13 2059

## 2013-10-18 NOTE — ED Notes (Signed)
Pt presents to ED today with complaints of left shoulder, neck, elbow, jaw and arm pain. Pt states she was sitting down reading when pain started.

## 2013-10-18 NOTE — Discharge Instructions (Signed)

## 2013-10-19 LAB — TROPONIN I: Troponin I: <0.3 ng/mL (ref <0.30)

## 2013-10-19 NOTE — ED Notes (Signed)
MD at bedside. 

## 2013-10-26 ENCOUNTER — Encounter: Payer: Self-pay | Admitting: Interventional Cardiology

## 2013-10-26 ENCOUNTER — Ambulatory Visit (INDEPENDENT_AMBULATORY_CARE_PROVIDER_SITE_OTHER): Payer: BC Managed Care – PPO | Admitting: Interventional Cardiology

## 2013-10-26 VITALS — BP 152/88 | HR 67 | Ht 64.0 in | Wt 156.8 lb

## 2013-10-26 DIAGNOSIS — E785 Hyperlipidemia, unspecified: Secondary | ICD-10-CM

## 2013-10-26 DIAGNOSIS — I10 Essential (primary) hypertension: Secondary | ICD-10-CM

## 2013-10-26 DIAGNOSIS — I2510 Atherosclerotic heart disease of native coronary artery without angina pectoris: Secondary | ICD-10-CM

## 2013-10-26 NOTE — Progress Notes (Signed)
Patient ID: [name], female   DOB: [birth year], 48 y.o.   MRN: [id number]    [address] [place], [place]  [address] Phone: [phone] Fax:  [phone]  Date:  10/26/2013   ID:  [name], DOB 1966/02/10, MRN [id number]  PCP:  Elyn Peers, MD   ASSESSMENT:  1. Coronary atherosclerotic heart disease without angina 2. Recent episode of left arm tingling and numbness, not cardiac based upon ER evaluation 3. Hyperlipidemia with intolerance of statins 4. Hypertension controlled  PLAN:  1. Continue the current medical regimen including aspirin. She is unable to tolerate statins   SUBJECTIVE: [name] is a 48 y.o. female no cardiopulmonary complaints. A [facility] panel was done. She is unable to tolerate statins. She has not had angina. 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 She does not yet have a job. Dr. Earlie Server is now her primary care physician.   Wt Readings from Last 3 Encounters:  10/26/13 156 lb 12.8 oz (71.124 kg)  10/18/13 158 lb (71.668 kg)  09/17/13 159 lb (72.122 kg)     Past Medical History  Diagnosis Date  . 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 Fibroids 2008  . Coronary artery disease     2 stents 2012  . Family history of early CAD   . Wears glasses   . Stented coronary artery 2012    Current Outpatient Prescriptions  Medication Sig Dispense Refill  . aspirin 81 MG tablet Take 81 mg by mouth daily.      . Calcium Carbonate-Vitamin D (CALCIUM + D PO) Take by mouth.      Marland Kitchen lisinopril (PRINIVIL,ZESTRIL) 2.5 MG tablet Take 2.5 mg by mouth daily.      . nitroGLYCERIN (NITROSTAT) 0.4 MG SL tablet Place 1 tablet (0.4 mg total) under the tongue every 5 (five) minutes as needed for chest pain.  25 tablet  3  . Omega-3 Fatty Acids (FISH OIL) 1000 MG CAPS Take 1 capsule by mouth daily.       No current facility-administered medications for this visit.    Allergies:    Allergies  Allergen Reactions  . Crestor [Rosuvastatin]      Severe muscle aches on Crestor 5 mg once daily  . Erythromycin Other (See Comments)    Stomach cramps  . Lipitor [Atorvastatin] Other (See Comments)    Aches in muscles/barely can move    Social History:  The patient  reports that she has never smoked. She has never used smokeless tobacco. She reports that she does not drink alcohol or use illicit drugs.   ROS:  Please see the history of present illness.   No orthopnea, PND, palpitations or other complaints.   All other systems reviewed and negative.   OBJECTIVE: VS:  BP 152/88  Pulse 67  Ht 5\' 4"  (1.626 m)  Wt 156 lb 12.8 oz (71.124 kg)  BMI 26.90 kg/m2  LMP 09/21/2013 Well nourished, well developed, in no acute distress, healthy-appearing HEENT: normal Neck: JVD  flat. Carotid bruit absent  Cardiac:  normal S1, S2; RRR; no murmur Lungs:  clear to auscultation bilaterally, no wheezing, rhonchi or rales Abd: soft, nontender, no hepatomegaly Ext: Edema absent. Pulses 2+ Skin: warm and dry Neuro:  CNs 2-12 intact, no focal abnormalities noted  EKG:  Not repeated       Signed, Illene Labrador III, MD 10/26/2013 3:41 PM

## 2013-10-26 NOTE — Patient Instructions (Signed)
Your physician recommends that you continue on your current medications as directed. Please refer to the Current Medication list given to you today.  Your physician wants you to follow-up in: 6 months. You will receive a reminder letter in the mail two months in advance. If you don't receive a letter, please call our office to schedule the follow-up appointment.  

## 2014-02-01 ENCOUNTER — Encounter: Payer: Self-pay | Admitting: Interventional Cardiology

## 2014-04-02 HISTORY — PX: COLONOSCOPY WITH PROPOFOL: SHX5780

## 2014-05-28 ENCOUNTER — Other Ambulatory Visit: Payer: Self-pay | Admitting: *Deleted

## 2014-06-16 ENCOUNTER — Encounter: Payer: Self-pay | Admitting: *Deleted

## 2014-07-08 ENCOUNTER — Encounter: Payer: Self-pay | Admitting: *Deleted

## 2014-07-08 NOTE — Progress Notes (Signed)
SPIRE II Informed Consent   Subject Name:  [name] Subject met inclusion and exclusion criteria. The informed consent form, study requirements and expectations were reviewed with the subject and questions and concerns were addressed prior to the signing of the consent form. The subject verbalized understanding of the trail requirements. The subject agreed to participate in the SPIRE II trial and signed the informed consent. The informed consent was obtained prior to performance of any protocol-specific procedures for the subject. A copy of the signed informed consent was given to the subject and a copy was placed in the subject's medical record.   Hugh Pruitt, RN 01/25/2014  

## 2014-07-09 ENCOUNTER — Encounter: Payer: Self-pay | Admitting: *Deleted

## 2014-07-13 ENCOUNTER — Ambulatory Visit (INDEPENDENT_AMBULATORY_CARE_PROVIDER_SITE_OTHER): Payer: Self-pay | Admitting: Interventional Cardiology

## 2014-07-13 ENCOUNTER — Ambulatory Visit: Payer: Self-pay | Admitting: Interventional Cardiology

## 2014-07-13 ENCOUNTER — Encounter: Payer: Self-pay | Admitting: Interventional Cardiology

## 2014-07-13 VITALS — BP 138/70 | HR 60 | Ht 65.0 in | Wt 159.1 lb

## 2014-07-13 DIAGNOSIS — I10 Essential (primary) hypertension: Secondary | ICD-10-CM

## 2014-07-13 DIAGNOSIS — I2510 Atherosclerotic heart disease of native coronary artery without angina pectoris: Secondary | ICD-10-CM

## 2014-07-13 DIAGNOSIS — E785 Hyperlipidemia, unspecified: Secondary | ICD-10-CM

## 2014-07-13 NOTE — Progress Notes (Signed)
Cardiology Office Note   Date:  07/13/2014   ID:  BRYER GOTTSCH, DOB 06-20-1965, MRN [id number]  PCP:  Elyn Peers, MD  Cardiologist:   Sinclair Grooms, MD   No chief complaint on file.     History of Present Illness: [name] is a 49 y.o. female who presents for management of coronary artery disease. She underwent stenting. She denies angina, medication side effects, edema, syncope, and claudication. 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 There are no limitations in her daily activities with reference to angina or other complaints.    Past Medical History  Diagnosis Date  . 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 Fibroids 2008  . Coronary artery disease     2 stents 2012  . Family history of early CAD   . Wears glasses   . Stented coronary artery 2012    Past Surgical History  Procedure Laterality Date  . Cardiac catheterization  9/12    placed 2 stints  . Breast biopsy Right 12/30/2012    Procedure: CYST EXCISION BREAST;  Surgeon: Marcello Moores A. Cornett, MD;  Location: [facility];  Service: General;  Laterality: Right;     Current Outpatient Prescriptions  Medication Sig Dispense Refill  . aspirin 81 MG tablet Take 81 mg by mouth daily.    . Calcium Carbonate-Vitamin D (CALCIUM + D PO) Take 600 mg by mouth.     . Investigational - Study Medication Inject 150 mg into the skin every 14 (fourteen) days. bococizumab vs placebo    . lisinopril (PRINIVIL,ZESTRIL) 2.5 MG tablet Take 2.5 mg by mouth daily.    . nitroGLYCERIN (NITROSTAT) 0.4 MG SL tablet Place 1 tablet (0.4 mg total) under the tongue every 5 (five) minutes as needed for chest pain. 25 tablet 3  . Omega-3 Fatty Acids (FISH OIL) 1000 MG CAPS Take 1 capsule by mouth daily.     No current facility-administered medications for this visit.    Allergies:   Crestor; Erythromycin; and Lipitor    Social History:  The patient  reports that she has never smoked. She has never used smokeless  tobacco. She reports that she does not drink alcohol or use illicit drugs.   Family History:  The patient's family history includes Heart disease in her father; Hypertension in her brother and sister; Ovarian cancer (age of onset: 57) in her mother.    ROS:  Please see the history of present illness.   Otherwise, review of systems are positive for intolerance of statins..   All other systems are reviewed and negative.    PHYSICAL EXAM: VS:  BP 138/70 mmHg  Pulse 60  Ht 5\' 5"  (1.651 m)  Wt 159 lb 1.9 oz (72.176 kg)  BMI 26.48 kg/m2 , BMI Body mass index is 26.48 kg/(m^2). GEN: Well nourished, well developed, in no acute distress HEENT: normal Neck: no JVD, carotid bruits, or masses Cardiac: RRR; no murmurs, rubs, or gallops,no edema  Respiratory:  clear to auscultation bilaterally, normal work of breathing GI: soft, nontender, nondistended, + BS MS: no deformity or atrophy Skin: warm and dry, no rash Neuro:  Strength and sensation are intact Psych: euthymic mood, full affect   EKG:  EKG is not ordered today.   Recent Labs: 09/08/2013: ALT 14 10/18/2013: BUN 11; Creatinine 0.80; Hemoglobin 12.3; Platelets 160; Potassium 3.9; Sodium 141    Lipid Panel    Component Value Date/Time   CHOL 148 09/08/2013 1137  TRIG 36.0 09/08/2013 1137   HDL 54.50 09/08/2013 1137   CHOLHDL 3 09/08/2013 1137   VLDL 7.2 09/08/2013 1137   LDLCALC 86 09/08/2013 1137      Wt Readings from Last 3 Encounters:  07/13/14 159 lb 1.9 oz (72.176 kg)  10/26/13 156 lb 12.8 oz (71.124 kg)  10/18/13 158 lb (71.668 kg)      Other studies Reviewed: Additional studies/ records that were reviewed today include: Reviewed the location of previously placed stents.. Review of the above records demonstrates: Mid and distal LAD identified as the site of prior stents   ASSESSMENT AND PLAN:   Coronary artery disease involving native coronary artery of native heart without angina pectoris  Essential  hypertension, benign: Controlled  Hyperlipidemia: Managed with diet, physical activity, and weight control.     Current medicines are reviewed at length with the patient today.  The patient does not have concerns regarding medicines.  The following changes have been made:  no change  Labs/ tests ordered today include:  No orders of the defined types were placed in this encounter.     Disposition:   FU with Linard Millers in 12   months   Signed, Sinclair Grooms, MD  07/13/2014 11:13 AM    [facility] [place], [place], [place]  [address] Phone: [phone]; Fax: [phone]

## 2014-07-13 NOTE — Patient Instructions (Signed)
Your physician recommends that you continue on your current medications as directed. Please refer to the Current Medication list given to you today.  Your physician discussed the importance of regular exercise and recommended that you start or continue a regular exercise program for good health.  Your physician wants you to follow-up in: 1 year with Dr.Smith You will receive a reminder letter in the mail two months in advance. If you don't receive a letter, please call our office to schedule the follow-up appointment.  

## 2015-02-07 ENCOUNTER — Encounter: Payer: Self-pay | Admitting: *Deleted

## 2015-02-07 DIAGNOSIS — Z006 Encounter for examination for normal comparison and control in clinical research program: Secondary | ICD-10-CM

## 2015-02-07 NOTE — Progress Notes (Signed)
SPIRE Research study termination information given to patient . Instructed patient NOT to take any more study drug, Last dose was 01/24/2015. Verbalizes understanding.

## 2015-02-07 NOTE — Progress Notes (Signed)
Left telephone message about SPIRE Research Study. Patient instructed to call Office.

## 2015-02-11 ENCOUNTER — Telehealth: Payer: Self-pay | Admitting: Interventional Cardiology

## 2015-02-11 NOTE — Telephone Encounter (Signed)
Left message to call back  

## 2015-02-11 NOTE — Telephone Encounter (Signed)
New Message    Pt calling stating that Dr. Tamala Julian had her in a Cholesterol study, the study has terminated and the pt wants to know what she should do now in regards to medication. Please call back and advise.

## 2015-02-14 NOTE — Telephone Encounter (Signed)
Called stating that she was in [facility] study and that has been terminated.  She wants to know what she should do now about Cholesterol.  Spoke w/Dr. Tamala Julian who wants to refer her to Lipid clinic.  Will make referral to Lipid clinic.

## 2015-02-15 NOTE — Telephone Encounter (Addendum)
Pt scheduled in lipid clinic for this Friday 11/18.

## 2015-02-18 ENCOUNTER — Ambulatory Visit (INDEPENDENT_AMBULATORY_CARE_PROVIDER_SITE_OTHER): Payer: BLUE CROSS/BLUE SHIELD | Admitting: Pharmacist

## 2015-02-18 DIAGNOSIS — E785 Hyperlipidemia, unspecified: Secondary | ICD-10-CM | POA: Diagnosis not present

## 2015-02-18 NOTE — Progress Notes (Signed)
HPI  [name] was referred to lipid clinic by Dr. Tamala Julian given CAD (PCI in 2012) and inability to take statin.  Patient's PCI was in 2012 years ago, and appears to have an LDL of ~ 110 mg/dL at time of PCI.  Would like to see an LDL reduction of 50% (thus < 55 mg/dL) given CAD at young age and a low LDL.  Patient has failed lipitor 10 mg (04/2012) due to severe muscle aches, and failed Crestor 5 mg qd (07/2013) due to severe muscle aches as well.  The side effects from Crestor started after taking it for just 3-4 days, and resolved a week or so after stopping it.  Patient's father had an MI in his 54's.  Pt was enrolled in the [facility] trial October 2015.  The study has now ended and patient took the last dose of study drug on 01/24/15.  She did not have any problems during the study.  She is here today to discuss her options now.   RF:  CAD (PCI 2012), family h/o CAD - LDL goal at least < 70, and prefer < 55 mg/dL (for 50% reduction) Meds:  Fish oil 1 g/d Intolerant:  Lipitor 10 mg qd, Crestor 5 mg qd (both caused severe muscle aches nearly preventing her from walking).  Labs *No more recent labs since pt has been in research trial* 08/2013:  TC 148, TG 36, HDL 54, LDL 86, LFTs normal (not on LDL lowering therapy)  Diet/Exercise:  Pt continues to watch her diet and limit cholesterol intake.  She now has a more sedentary job so she is waking up at 5:30 in the morning to make sure she gets her exercise in.   Current Outpatient Prescriptions  Medication Sig Dispense Refill  . aspirin 81 MG tablet Take 81 mg by mouth daily.    . Calcium Carbonate-Vitamin D (CALCIUM + D PO) Take 600 mg by mouth.     . Investigational - Study Medication Inject 150 mg into the skin every 14 (fourteen) days. bococizumab vs placebo    . lisinopril (PRINIVIL,ZESTRIL) 2.5 MG tablet Take 2.5 mg by mouth daily.    . nitroGLYCERIN (NITROSTAT) 0.4 MG SL tablet Place 1 tablet (0.4 mg total) under the tongue every 5 (five) minutes as  needed for chest pain. 25 tablet 3  . Omega-3 Fatty Acids (FISH OIL) 1000 MG CAPS Take 1 capsule by mouth daily.     No current facility-administered medications for this visit.   Allergies  Allergen Reactions  . Crestor [Rosuvastatin]     Severe muscle aches on Crestor 5 mg once daily  . Erythromycin Other (See Comments)    Stomach cramps  . Lipitor [Atorvastatin] Other (See Comments)    Aches in muscles/barely can move   Family History  Problem Relation Age of Onset  . Heart disease Father   . Ovarian cancer Mother 73    ovarian sarcoma  . Hypertension Brother   . Hypertension Sister    Assessment and Plan 1.  Hyperlipidemia-  Pt has a history of ASCVD.  She has tried and failed Lipitor and Crestor at low doses.  She has not tried Zetia, but due to lack of outcomes data with Zetia monotherapy, I do not think this is an appropriate option for her.  She would benefit from PCSK-9.  We do not know what treatment arm she was in.  Will wait at least 40 days since her last injection before rechecking labs.  If  LDL is > 70 mg/dL, will pursue PCSK-9 therapy.

## 2015-03-16 ENCOUNTER — Other Ambulatory Visit (INDEPENDENT_AMBULATORY_CARE_PROVIDER_SITE_OTHER): Payer: BLUE CROSS/BLUE SHIELD | Admitting: *Deleted

## 2015-03-16 DIAGNOSIS — E785 Hyperlipidemia, unspecified: Secondary | ICD-10-CM

## 2015-03-16 LAB — LIPID PANEL
CHOLESTEROL: 149 mg/dL (ref 125–200)
HDL: 66 mg/dL (ref >=46)
LDL Cholesterol: 74 mg/dL (ref <130)
TRIGLYCERIDES: 44 mg/dL (ref <150)
Total CHOL/HDL Ratio: 2.3 Ratio (ref <=5.0)
VLDL: 9 mg/dL (ref <30)

## 2015-03-16 LAB — HEPATIC FUNCTION PANEL
ALK PHOS: 59 U/L (ref 33–130)
ALT: 20 U/L (ref 6–29)
AST: 39 U/L — AB (ref 10–35)
Albumin: 4.1 g/dL (ref 3.6–5.1)
BILIRUBIN INDIRECT: 0.4 mg/dL (ref 0.2–1.2)
Bilirubin, Direct: 0.1 mg/dL (ref <=0.2)
TOTAL PROTEIN: 7.1 g/dL (ref 6.1–8.1)
Total Bilirubin: 0.5 mg/dL (ref 0.2–1.2)

## 2015-03-18 ENCOUNTER — Telehealth: Payer: Self-pay | Admitting: Interventional Cardiology

## 2015-03-18 NOTE — Telephone Encounter (Signed)
F/u  Pt returning Rn phone call- can leave detailed message on vm; Please call back and discuss.

## 2015-03-18 NOTE — Telephone Encounter (Signed)
Informed pt of lab results. Pt verbalized understanding. 

## 2015-03-22 ENCOUNTER — Other Ambulatory Visit: Payer: Self-pay | Admitting: *Deleted

## 2015-03-22 ENCOUNTER — Encounter: Payer: Self-pay | Admitting: *Deleted

## 2015-03-22 DIAGNOSIS — Z006 Encounter for examination for normal comparison and control in clinical research program: Secondary | ICD-10-CM

## 2015-03-22 NOTE — Progress Notes (Signed)
Patient has completed participation in the [facility] II research trial.  No reported adverse/serious events to report.

## 2015-05-06 IMAGING — CR DG CHEST 2V
2 series · 2 of 2 positions shown · non-contrast
Comparison: DG CHEST 2 VIEW dated 12/26/2010

CLINICAL DATA: Chest pain.  History of coronary artery disease.

EXAM:
CHEST - 2 VIEW

[w chest pa]
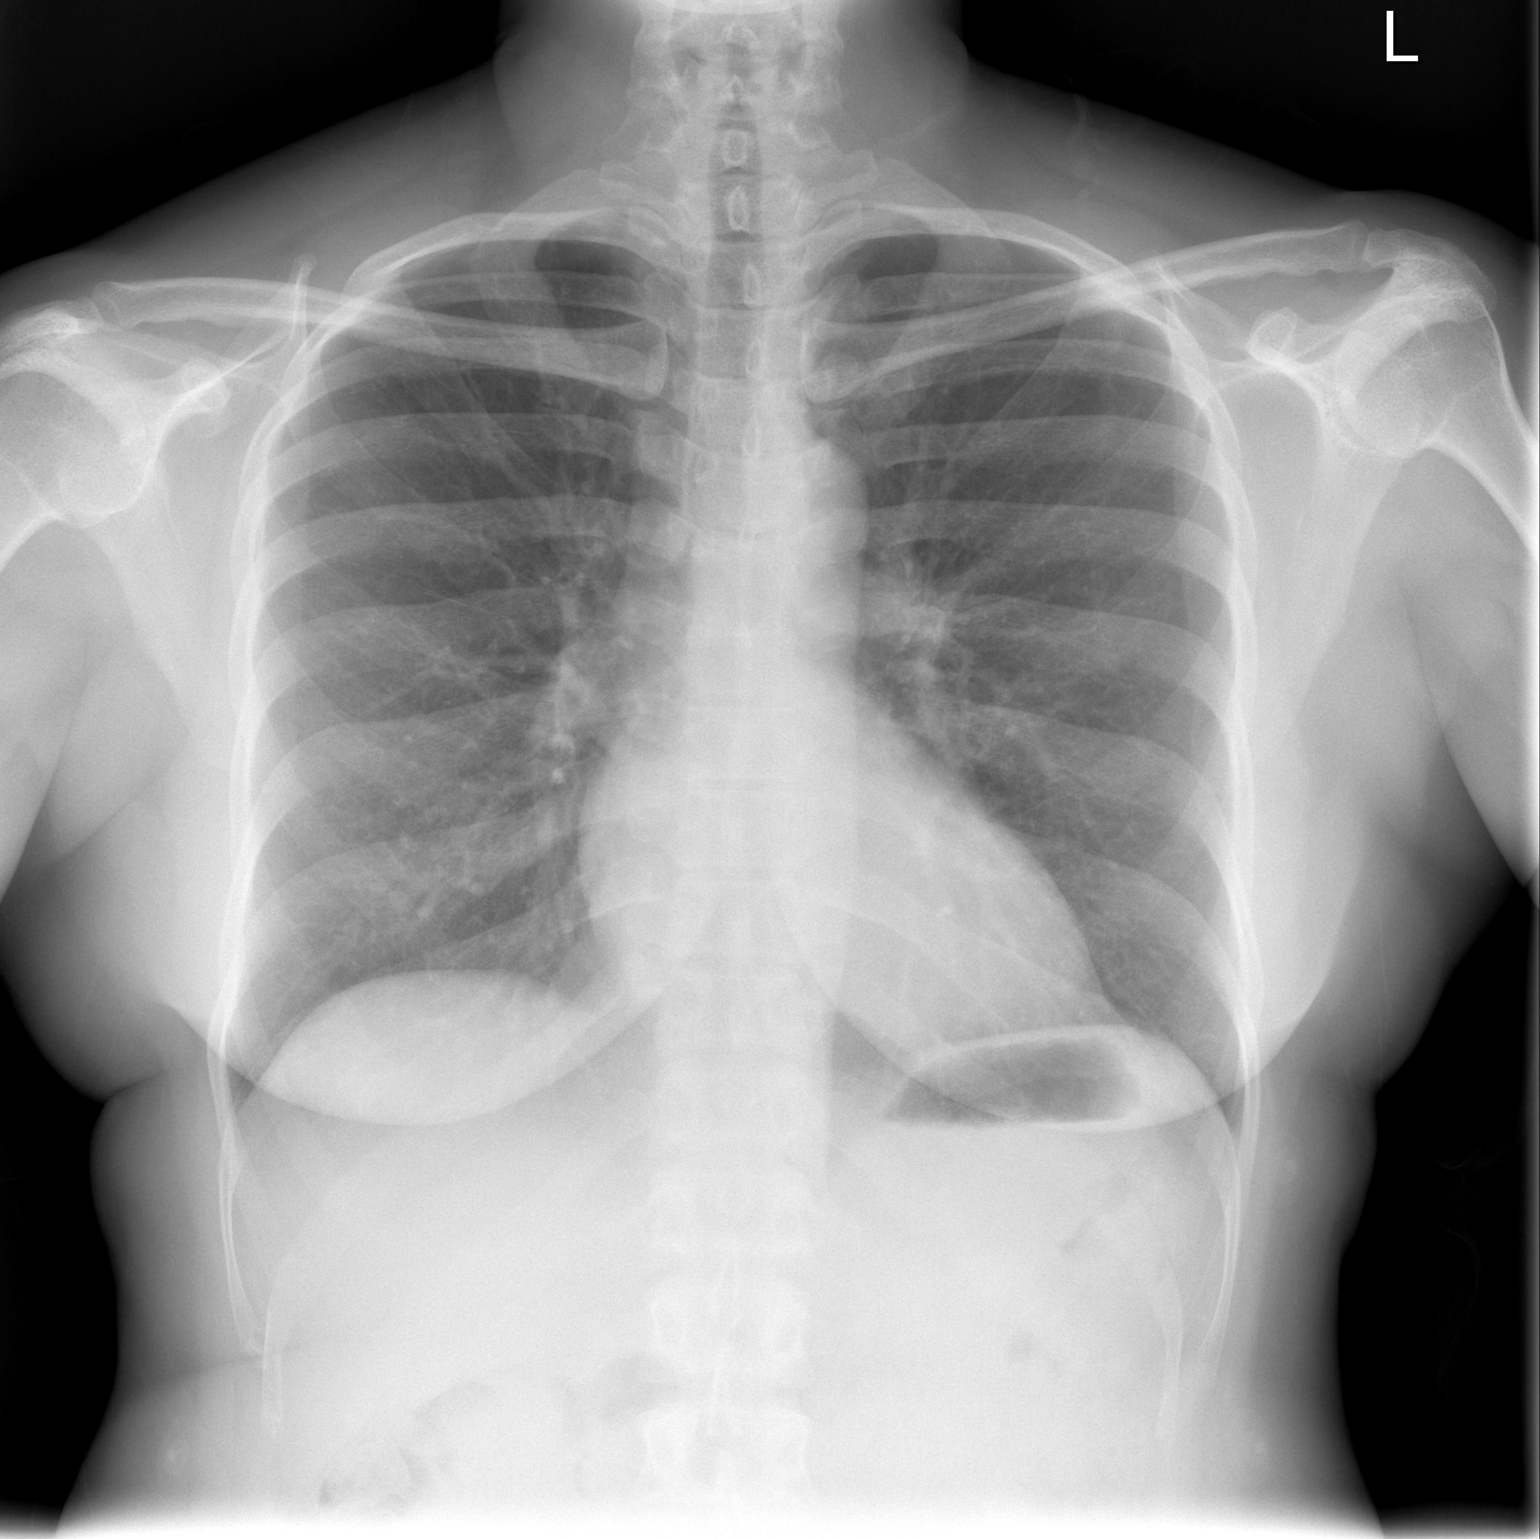

[w chest lat]
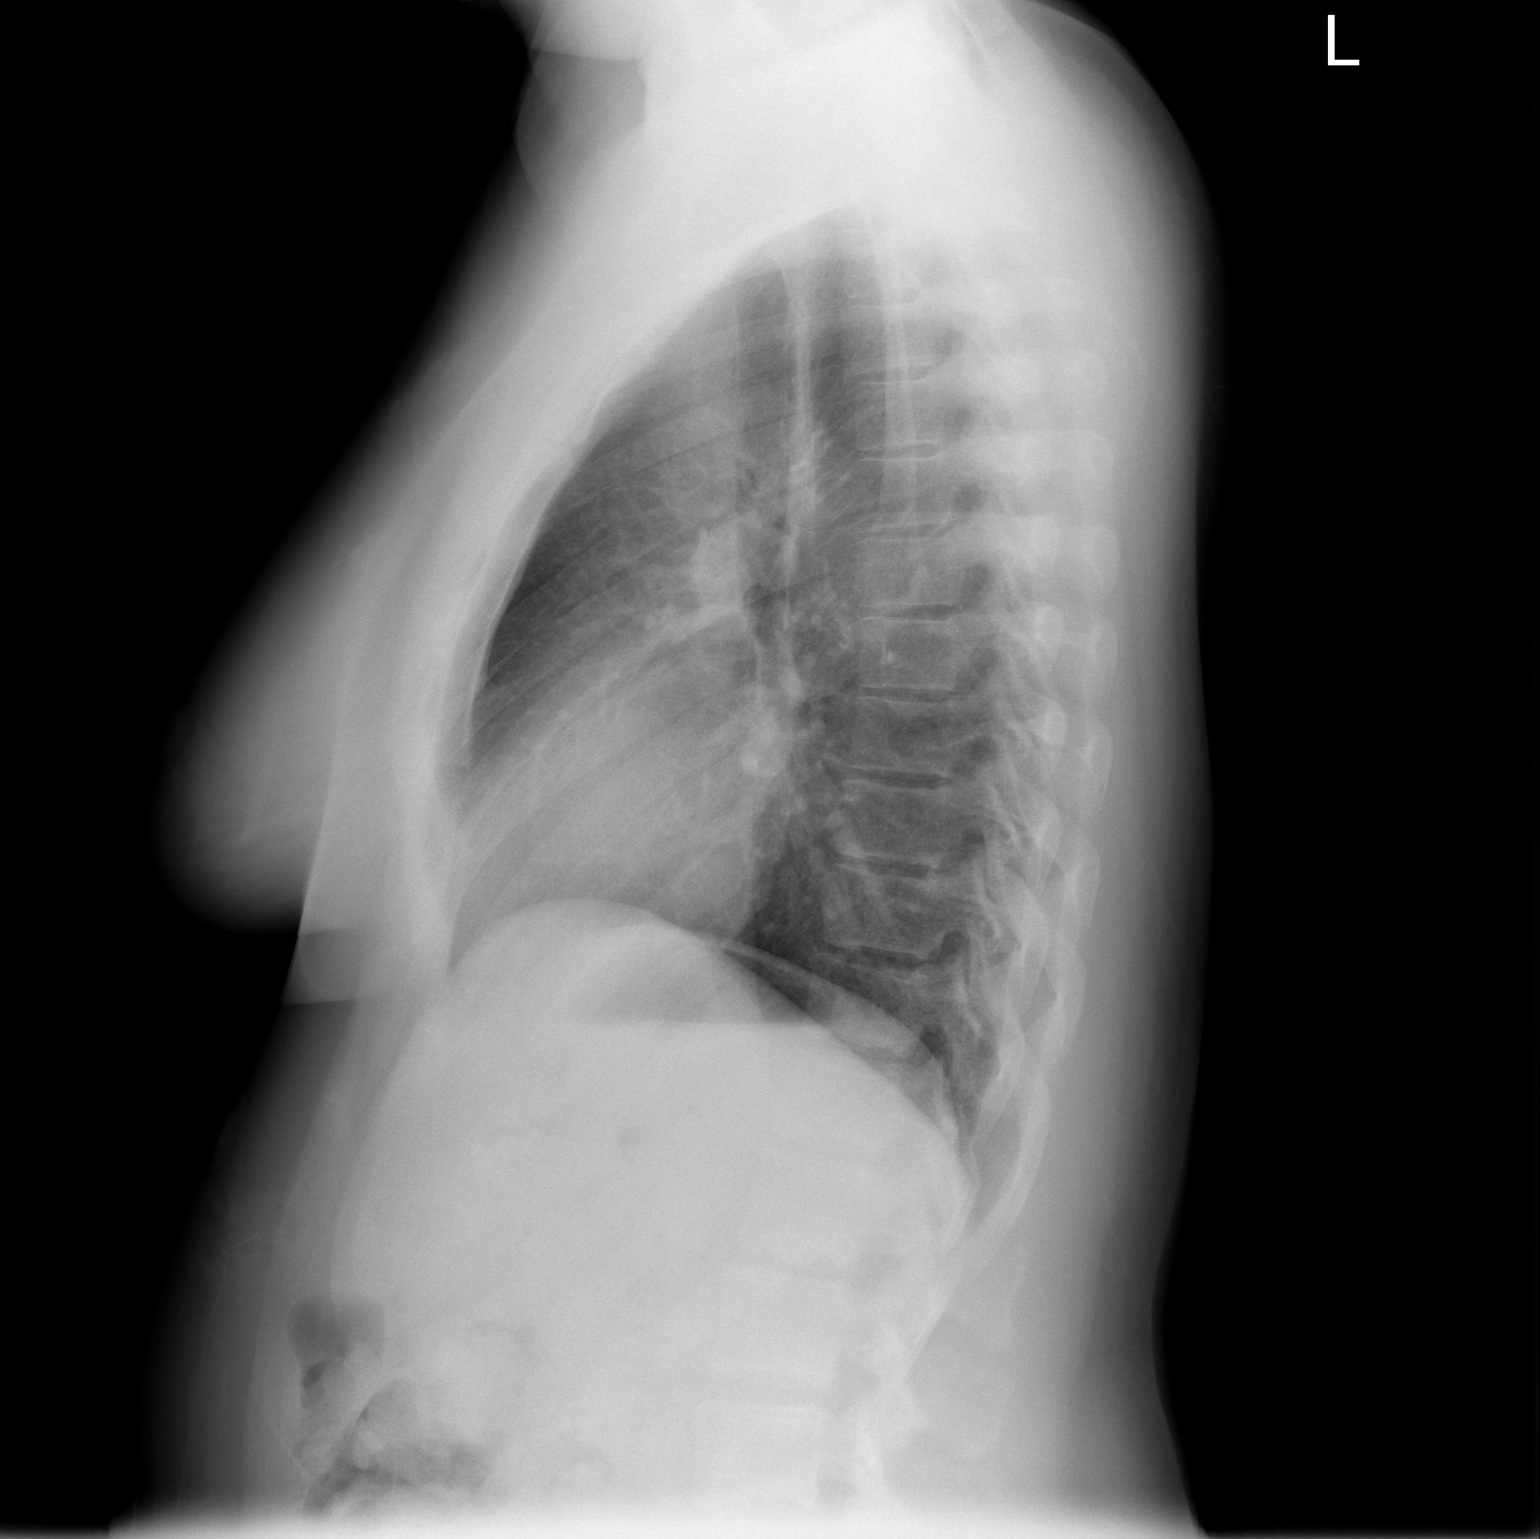

[2 of 2 positions shown; findings below may reference images not displayed]

FINDINGS: The heart size and mediastinal contours are within normal limits.
There is no evidence of pulmonary edema, consolidation,
pneumothorax, nodule or pleural fluid. The visualized skeletal
structures are unremarkable.
IMPRESSION: No active disease.

## 2015-05-20 ENCOUNTER — Other Ambulatory Visit: Payer: Self-pay | Admitting: Gastroenterology

## 2015-08-18 ENCOUNTER — Telehealth: Payer: Self-pay | Admitting: Pharmacist

## 2015-08-18 DIAGNOSIS — E785 Hyperlipidemia, unspecified: Secondary | ICD-10-CM

## 2015-08-18 MED ORDER — EZETIMIBE 10 MG PO TABS: 10.0000 mg | ORAL_TABLET | Freq: Every day | ORAL | Status: DC

## 2015-08-18 NOTE — Telephone Encounter (Signed)
Pt returned call.  Submitted paperwork for PCSK-9 inhibitor.  [employer] [place] denied coverage because pt was not on Zetia.  Pt agreed to try Zetia knowing that if LDL is still > 70 mg/dL, will pursue PCSK-9 inhibitor again.  She will come in for baseline labs next week given she has been out of the [facility] trial for almost 6 months.  Follow up labs for Zetia made for August when she is here to see Dr. Tamala Julian.

## 2015-08-25 ENCOUNTER — Other Ambulatory Visit (INDEPENDENT_AMBULATORY_CARE_PROVIDER_SITE_OTHER): Payer: BLUE CROSS/BLUE SHIELD | Admitting: *Deleted

## 2015-08-25 DIAGNOSIS — E785 Hyperlipidemia, unspecified: Secondary | ICD-10-CM

## 2015-08-25 LAB — HEPATIC FUNCTION PANEL
ALBUMIN: 4.1 g/dL (ref 3.6–5.1)
ALK PHOS: 57 U/L (ref 33–130)
ALT: 23 U/L (ref 6–29)
AST: 43 U/L — AB (ref 10–35)
BILIRUBIN DIRECT: 0.1 mg/dL (ref <=0.2)
BILIRUBIN TOTAL: 0.5 mg/dL (ref 0.2–1.2)
Indirect Bilirubin: 0.4 mg/dL (ref 0.2–1.2)
Total Protein: 6.8 g/dL (ref 6.1–8.1)

## 2015-08-25 LAB — LIPID PANEL
CHOL/HDL RATIO: 2.6 ratio (ref <=5.0)
Cholesterol: 187 mg/dL (ref 125–200)
HDL: 71 mg/dL (ref >=46)
LDL Cholesterol: 106 mg/dL (ref <130)
Triglycerides: 49 mg/dL (ref <150)
VLDL: 10 mg/dL (ref <30)

## 2015-08-25 NOTE — Addendum Note (Signed)
Addended by: Eulis Foster on: 08/25/2015 07:47 AM   Modules accepted: Orders

## 2015-10-21 ENCOUNTER — Other Ambulatory Visit: Payer: Self-pay | Admitting: Pharmacist

## 2015-10-21 DIAGNOSIS — E785 Hyperlipidemia, unspecified: Secondary | ICD-10-CM

## 2015-10-31 ENCOUNTER — Other Ambulatory Visit: Payer: BLUE CROSS/BLUE SHIELD | Admitting: *Deleted

## 2015-10-31 DIAGNOSIS — E785 Hyperlipidemia, unspecified: Secondary | ICD-10-CM

## 2015-10-31 LAB — LIPID PANEL
Cholesterol: 151 mg/dL (ref 125–200)
HDL: 71 mg/dL (ref >=46)
LDL Cholesterol: 67 mg/dL (ref <130)
TRIGLYCERIDES: 67 mg/dL (ref <150)
Total CHOL/HDL Ratio: 2.1 Ratio (ref <=5.0)
VLDL: 13 mg/dL (ref <30)

## 2015-10-31 LAB — HEPATIC FUNCTION PANEL
ALK PHOS: 64 U/L (ref 33–130)
ALT: 29 U/L (ref 6–29)
AST: 51 U/L — AB (ref 10–35)
Albumin: 4.3 g/dL (ref 3.6–5.1)
Bilirubin, Direct: 0.1 mg/dL (ref <=0.2)
Indirect Bilirubin: 0.5 mg/dL (ref 0.2–1.2)
TOTAL PROTEIN: 6.7 g/dL (ref 6.1–8.1)
Total Bilirubin: 0.6 mg/dL (ref 0.2–1.2)

## 2015-11-01 ENCOUNTER — Ambulatory Visit (INDEPENDENT_AMBULATORY_CARE_PROVIDER_SITE_OTHER): Payer: BLUE CROSS/BLUE SHIELD | Admitting: Interventional Cardiology

## 2015-11-01 ENCOUNTER — Encounter: Payer: Self-pay | Admitting: Interventional Cardiology

## 2015-11-01 ENCOUNTER — Other Ambulatory Visit: Payer: BLUE CROSS/BLUE SHIELD

## 2015-11-01 VITALS — BP 142/86 | HR 57 | Ht 65.0 in | Wt 172.0 lb

## 2015-11-01 DIAGNOSIS — E785 Hyperlipidemia, unspecified: Secondary | ICD-10-CM | POA: Diagnosis not present

## 2015-11-01 DIAGNOSIS — I25111 Atherosclerotic heart disease of native coronary artery with angina pectoris with documented spasm: Secondary | ICD-10-CM | POA: Diagnosis not present

## 2015-11-01 DIAGNOSIS — I10 Essential (primary) hypertension: Secondary | ICD-10-CM | POA: Diagnosis not present

## 2015-11-01 NOTE — Progress Notes (Signed)
Cardiology Office Note    Date:  11/01/2015   ID:  [name], DOB 09-12-1965, MRN [id number]  PCP:  Wenda Low, MD  Cardiologist: Sinclair Grooms, MD   Chief Complaint  Patient presents with  . Coronary Artery Disease    History of Present Illness:  [name] is a 50 y.o. female for follow-up of CAD, LAD DES 2, hypertension, and hyperlipidemia.  [name] is doing well. She is not having angina. She is converted to a vegetarian diet. Her lipids were recently done and are the best ever. She is only taking Zetia 10 mg per day.  Past Medical History:  Diagnosis Date  . Blocked artery ([place]) 9/12  . Coronary artery disease    2 stents 2012  . Family history of early CAD   . Fibroids 2008  . Stented coronary artery 2012  . Wears glasses     Past Surgical History:  Procedure Laterality Date  . BREAST BIOPSY Right 12/30/2012   Procedure: CYST EXCISION BREAST;  Surgeon: Marcello Moores A. Cornett, MD;  Location: [place];  Service: General;  Laterality: Right;  . CARDIAC CATHETERIZATION  9/12   placed 2 stints    Current Medications: Outpatient Medications Prior to Visit  Medication Sig Dispense Refill  . aspirin 81 MG tablet Take 81 mg by mouth daily.    . Calcium Carbonate-Vitamin D (CALCIUM + D PO) Take 600 mg by mouth.     . ezetimibe (ZETIA) 10 MG tablet Take 1 tablet (10 mg total) by mouth daily. 30 tablet 6  . nitroGLYCERIN (NITROSTAT) 0.4 MG SL tablet Place 1 tablet (0.4 mg total) under the tongue every 5 (five) minutes as needed for chest pain. 25 tablet 3  . Omega-3 Fatty Acids (FISH OIL) 1000 MG CAPS Take 1 capsule by mouth daily.    Marland Kitchen lisinopril (PRINIVIL,ZESTRIL) 2.5 MG tablet Take 2.5 mg by mouth daily.     No facility-administered medications prior to visit.      Allergies:   Crestor [rosuvastatin]; Erythromycin; and Lipitor [atorvastatin]   Social History   Social History  . Marital status: Divorced    Spouse name: N/A  . Number of  children: N/A  . Years of education: N/A   Social History Main Topics  . Smoking status: Never Smoker  . Smokeless tobacco: Never Used  . Alcohol use No  . Drug use: No  . Sexual activity: Yes    Birth control/ protection: Other-see comments     Comment: PARTNER HAD VAS.   Other Topics Concern  . None   Social History Narrative  . None     Family History:  The patient's family history includes Heart disease in her father; Hypertension in her brother and sister; Ovarian cancer (age of onset: 42) in her mother.   ROS:   Please see the history of present illness.    Weight gain but otherwise no complaints. Some snoring. Some constipation. Menopause.  All other systems reviewed and are negative.   PHYSICAL EXAM:   VS:  BP (!) 142/86   Pulse (!) 57   Ht 5\' 5"  (1.651 m)   Wt 172 lb (78 kg)   BMI 28.62 kg/m    GEN: Well nourished, well developed, in no acute distress  HEENT: normal  Neck: no JVD, carotid bruits, or masses Cardiac: RRR; no murmurs, rubs, or gallops,no edema  Respiratory:  clear to auscultation bilaterally, normal work of breathing GI: soft, nontender, nondistended, +  BS MS: no deformity or atrophy  Skin: warm and dry, no rash Neuro:  Alert and Oriented x 3, Strength and sensation are intact Psych: euthymic mood, full affect  Wt Readings from Last 3 Encounters:  11/01/15 172 lb (78 kg)  07/13/14 159 lb 1.9 oz (72.2 kg)  10/26/13 156 lb 12.8 oz (71.1 kg)      Studies/Labs Reviewed:   EKG:  EKG  Sinus bradycardia otherwise normal.  Recent Labs: 10/31/2015: ALT 29   Lipid Panel    Component Value Date/Time   CHOL 151 10/31/2015 0800   TRIG 67 10/31/2015 0800   HDL 71 10/31/2015 0800   CHOLHDL 2.1 10/31/2015 0800   VLDL 13 10/31/2015 0800   LDLCALC 67 10/31/2015 0800    Additional studies/ records that were reviewed today include:  No new data other than lipids noted above    ASSESSMENT:    1. 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 Essential hypertension, benign   3. Hyperlipidemia      PLAN:  In order of problems listed above:  1. Aerobic activity, weight control, and vegetarian diet as she is doing. Notify us if any angina. 2. Incorporate low sodium intake into her diet. 3. EXCELLENT lipid control on vegetarian diet with minimal medication.  Clinical follow-up in one year.    Medication Adjustments/Labs and Tests Ordered: Current medicines are reviewed at length with the patient today.  Concerns regarding medicines are outlined above.  Medication changes, Labs and Tests ordered today are listed in the Patient Instructions below. There are no Patient Instructions on file for this visit.   Signed, Sinclair Grooms, MD  11/01/2015 8:28 AM    [address], [place], [place]  [address] Phone: [phone]; Fax: [phone]

## 2015-11-01 NOTE — Patient Instructions (Signed)
Medication Instructions:  None  Labwork: None  Testing/Procedures: None  Follow-Up: Your physician wants you to follow-up in: 1 year with Dr. Tamala Julian. You will receive a reminder letter in the mail two months in advance. If you don't receive a letter, please call our office to schedule the follow-up appointment.   Any Other Special Instructions Will Be Listed Below (If Applicable).  Continue exercising and your current diet.     If you need a refill on your cardiac medications before your next appointment, please call your pharmacy.

## 2015-11-08 ENCOUNTER — Other Ambulatory Visit: Payer: Self-pay

## 2015-11-08 DIAGNOSIS — E785 Hyperlipidemia, unspecified: Secondary | ICD-10-CM

## 2016-02-21 ENCOUNTER — Telehealth: Payer: Self-pay | Admitting: Interventional Cardiology

## 2016-02-21 NOTE — Telephone Encounter (Signed)
The patient is cleared to participate in moderate to high intensity aerobic and low intensity isometric training.

## 2016-02-21 NOTE — Telephone Encounter (Signed)
Pt Physical Trainer is calling to get a medical clearance for her to work out.

## 2016-02-21 NOTE — Telephone Encounter (Signed)
Spoke with Rachel Bo to clarify what kinds of activity pt will be doing.  Rachel Bo states they have general fitness classes and that pt would be doing general exercises such as jumping jacks, but there are some strenuous activities that they participate in.  They need a clearance letter that it is ok for the pt to participate in these activities.  Will forward to Dr. Tamala Julian for review and advisement.

## 2016-02-22 ENCOUNTER — Encounter: Payer: Self-pay | Admitting: *Deleted

## 2016-02-22 ENCOUNTER — Telehealth: Payer: Self-pay | Admitting: Interventional Cardiology

## 2016-02-22 NOTE — Telephone Encounter (Signed)
Doubt Zetia is responsible. Stop for awhile to see if it improves(up to 4 weeks). She should then resume and give follow-up.

## 2016-02-22 NOTE — Telephone Encounter (Signed)
Follow up ° ° °Pt verbalized that she is returning call for rn °

## 2016-02-22 NOTE — Telephone Encounter (Signed)
Left message to call back  

## 2016-02-22 NOTE — Telephone Encounter (Signed)
Spoke with [name] and she states when she first started taking the Zetia she was fine. Starting about 2 months ago she noticed some mild joint/muscle aches.  About 3-4 weeks ago she started taking Zetia every other day and there was no improvement.  Advised [name] I would send information to Dr. Tamala Julian for review and advisement.

## 2016-02-22 NOTE — Telephone Encounter (Signed)
Pt plans to pick up letter Monday.

## 2016-02-22 NOTE — Telephone Encounter (Signed)
New message  Pt is calling concerning medication  Zetia 10mg   Making her muscles and joints ache  Please return her call/ok to lv msg

## 2016-02-22 NOTE — Telephone Encounter (Signed)
Letter prepared. Will contact pt for pick up or asked if she wants it mailed.

## 2016-02-27 NOTE — Telephone Encounter (Signed)
Informed pt of Dr. Smith's recommendations.  Pt verbalized understanding and was in agreement with this plan.  

## 2016-02-27 NOTE — Telephone Encounter (Signed)
Follow Up: ° ° ° °Returning your call from this morning. °

## 2016-02-27 NOTE — Telephone Encounter (Signed)
Left message to call back  

## 2016-04-04 ENCOUNTER — Encounter: Payer: Self-pay | Admitting: Interventional Cardiology

## 2016-04-09 ENCOUNTER — Telehealth: Payer: Self-pay | Admitting: Interventional Cardiology

## 2016-04-09 NOTE — Telephone Encounter (Signed)
Left message to call back  

## 2016-04-09 NOTE — Telephone Encounter (Signed)
°  New Prob   States she received a call regarding her blood work that was sent over from PCP. Please call.

## 2016-04-09 NOTE — Telephone Encounter (Signed)
New Message   Pt returning call. Will try and call back.

## 2016-04-10 NOTE — Telephone Encounter (Signed)
Spoke with pt and went over results with her again.  Pt states PCP had mentioned trying an injectable statin but she prefers to try diet and exercise as suggested by Dr. Tamala Julian.  Pt agreeable to check labs again just prior to seeing Dr. Tamala Julian in August.  Pt appreciative for assistance.

## 2016-09-19 ENCOUNTER — Other Ambulatory Visit: Payer: Self-pay | Admitting: Radiology

## 2016-10-23 ENCOUNTER — Encounter: Payer: Self-pay | Admitting: *Deleted

## 2016-11-06 ENCOUNTER — Other Ambulatory Visit: Payer: BLUE CROSS/BLUE SHIELD | Admitting: *Deleted

## 2016-11-06 DIAGNOSIS — I7090 Unspecified atherosclerosis: Secondary | ICD-10-CM

## 2016-11-06 DIAGNOSIS — E785 Hyperlipidemia, unspecified: Secondary | ICD-10-CM

## 2016-11-06 LAB — LIPID PANEL
Chol/HDL Ratio: 2.8 ratio (ref 0.0–4.4)
Cholesterol, Total: 190 mg/dL (ref 100–199)
HDL: 67 mg/dL (ref >39)
LDL CALC: 108 mg/dL — AB (ref 0–99)
Triglycerides: 75 mg/dL (ref 0–149)
VLDL CHOLESTEROL CAL: 15 mg/dL (ref 5–40)

## 2016-11-06 LAB — HEPATIC FUNCTION PANEL
ALBUMIN: 4.5 g/dL (ref 3.5–5.5)
ALT: 32 IU/L (ref 0–32)
AST: 52 IU/L — ABNORMAL HIGH (ref 0–40)
Alkaline Phosphatase: 87 IU/L (ref 39–117)
Bilirubin Total: 0.2 mg/dL (ref 0.0–1.2)
Bilirubin, Direct: 0.07 mg/dL (ref 0.00–0.40)
Total Protein: 7.2 g/dL (ref 6.0–8.5)

## 2016-11-08 ENCOUNTER — Encounter: Payer: Self-pay | Admitting: Interventional Cardiology

## 2016-11-08 ENCOUNTER — Ambulatory Visit (INDEPENDENT_AMBULATORY_CARE_PROVIDER_SITE_OTHER): Payer: BLUE CROSS/BLUE SHIELD | Admitting: Interventional Cardiology

## 2016-11-08 VITALS — BP 128/76 | HR 67 | Ht 65.0 in | Wt 172.0 lb

## 2016-11-08 DIAGNOSIS — I25119 Atherosclerotic heart disease of native coronary artery with unspecified angina pectoris: Secondary | ICD-10-CM | POA: Diagnosis not present

## 2016-11-08 DIAGNOSIS — I10 Essential (primary) hypertension: Secondary | ICD-10-CM

## 2016-11-08 DIAGNOSIS — E785 Hyperlipidemia, unspecified: Secondary | ICD-10-CM

## 2016-11-08 MED ORDER — FISH OIL 1000 MG PO CAPS: 2.0000 | ORAL_CAPSULE | Freq: Every day | ORAL | 3 refills | Status: DC

## 2016-11-08 NOTE — Patient Instructions (Signed)
Medication Instructions:  1) INCREASE [place] 3 Fatty Acids to 2 capsules per day  Labwork: Your physician recommends that you return for lab work in: 3 months (Lipid, liver)   Testing/Procedures: None  Follow-Up: Your physician recommends that you schedule a follow-up appointment at your convenience with our [facility].  Your physician wants you to follow-up in: 1 year with Dr. Tamala Julian.  You will receive a reminder letter in the mail two months in advance. If you don't receive a letter, please call our office to schedule the follow-up appointment.     Any Other Special Instructions Will Be Listed Below (If Applicable).     If you need a refill on your cardiac medications before your next appointment, please call your pharmacy.

## 2016-11-08 NOTE — Progress Notes (Signed)
Cardiology Office Note    Date:  11/08/2016   ID:  [name], DOB [birth year], MRN [id number]  PCP:  Wenda Low, MD  Cardiologist: Sinclair Grooms, MD   Chief Complaint  Patient presents with  . Coronary Artery Disease  . Follow-up    Hyperlipidemia    History of Present Illness:  [name] is a 51 y.o. female follow-up of CAD, LAD DES 2 in 2012, hypertension, and hyperlipidemia.   She is doing well. She denies angina. No dyspnea or difficulty with physical activity. No medication side effects. Unable to tolerate statin therapy and now unable to take Zetia. She is on a predominantly plan based diet with some meat intake.    Past Medical History:  Diagnosis Date  . 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 Coronary artery disease    2 stents 2012  . Family history of early CAD   . Fibroids 2008  . Stented coronary artery 2012  . Wears glasses     Past Surgical History:  Procedure Laterality Date  . BREAST BIOPSY Right 12/30/2012   Procedure: CYST EXCISION BREAST;  Surgeon: Marcello Moores A. Cornett, MD;  Location: [place];  Service: General;  Laterality: Right;  . CARDIAC CATHETERIZATION  9/12   placed 2 stints    Current Medications: Outpatient Medications Prior to Visit  Medication Sig Dispense Refill  . aspirin 81 MG tablet Take 81 mg by mouth daily.    . Calcium Carbonate-Vitamin D (CALCIUM + D PO) Take 600 mg by mouth.     Marland Kitchen lisinopril (PRINIVIL,ZESTRIL) 5 MG tablet Take 2.5 mg by mouth daily.  1  . nitroGLYCERIN (NITROSTAT) 0.4 MG SL tablet Place 1 tablet (0.4 mg total) under the tongue every 5 (five) minutes as needed for chest pain. 25 tablet 3  . Omega-3 Fatty Acids (FISH OIL) 1000 MG CAPS Take 2 capsules by mouth daily.    Marland Kitchen ezetimibe (ZETIA) 10 MG tablet Take 1 tablet (10 mg total) by mouth daily. (Patient not taking: Reported on 11/08/2016) 30 tablet 6   No facility-administered medications prior to visit.      Allergies:   Crestor  [rosuvastatin]; Erythromycin; and Lipitor [atorvastatin]   Social History   Social History  . Marital status: Divorced    Spouse name: N/A  . Number of children: N/A  . Years of education: N/A   Social History Main Topics  . Smoking status: Never Smoker  . Smokeless tobacco: Never Used  . Alcohol use No  . Drug use: No  . Sexual activity: Yes    Birth control/ protection: Other-see comments     Comment: PARTNER HAD VAS.   Other Topics Concern  . None   Social History Narrative  . None     Family History:  The patient's family history includes Heart disease in her father; Hypertension in her brother and sister; Ovarian cancer (age of onset: 24) in her mother.   ROS:   Please see the history of present illness.    Irregular heart beats but otherwise unremarkable.  All other systems reviewed and are negative.   PHYSICAL EXAM:   VS:  BP 128/76   Pulse 67   Ht 5\' 5"  (1.651 m)   Wt 172 lb (78 kg)   BMI 28.62 kg/m    GEN: Well nourished, well developed, in no acute distress  HEENT: normal  Neck: no JVD, carotid bruits, or masses Cardiac: RRR; no murmurs, rubs, or gallops,no edema  Respiratory:  clear to auscultation bilaterally, normal work of breathing GI: soft, nontender, nondistended, + BS MS: no deformity or atrophy  Skin: warm and dry, no rash Neuro:  Alert and Oriented x 3, Strength and sensation are intact Psych: euthymic mood, full affect  Wt Readings from Last 3 Encounters:  11/08/16 172 lb (78 kg)  11/01/15 172 lb (78 kg)  07/13/14 159 lb 1.9 oz (72.2 kg)      Studies/Labs Reviewed:   EKG:  EKG  Normal sinus rhythm with normal appearance  Recent Labs: 11/06/2016: ALT 32   Lipid Panel    Component Value Date/Time   CHOL 190 11/06/2016 0750   TRIG 75 11/06/2016 0750   HDL 67 11/06/2016 0750   CHOLHDL 2.8 11/06/2016 0750   CHOLHDL 2.1 10/31/2015 0800   VLDL 13 10/31/2015 0800   LDLCALC 108 (H) 11/06/2016 0750    Additional studies/ records  that were reviewed today include:  No new data    ASSESSMENT:    1. Coronary artery disease involving native coronary artery of native heart with angina pectoris ([place])   2. Essential hypertension, benign   3. Hyperlipidemia, unspecified hyperlipidemia type      PLAN:  In order of problems listed above:  1. Stable without angina. But his pain being in a training program that emphasizes isometric activity. We discussed incorporating more aerobic/isotonic activity and less stressful isometric. 2. Blood pressures is under excellent control. Target 130/85 mmHg or less. 3. She is unable to tolerate statins. Unable tolerate Zetia. Increase omega-3 fatty acids to 2 g per day. Referral back to lipid clinic to determine if PCS K-9 is now a consideration.  Overall doing well. Clinical follow-up in one year. Lipid target less than 70 for LDL.  Medication Adjustments/Labs and Tests Ordered: Current medicines are reviewed at length with the patient today.  Concerns regarding medicines are outlined above.  Medication changes, Labs and Tests ordered today are listed in the Patient Instructions below. Patient Instructions  Medication Instructions:  1) INCREASE [place] 3 Fatty Acids to 2 capsules per day  Labwork: Your physician recommends that you return for lab work in: 3 months (Lipid, liver)   Testing/Procedures: None  Follow-Up: Your physician recommends that you schedule a follow-up appointment at your convenience with our [facility].  Your physician wants you to follow-up in: 1 year with Dr. Tamala Julian.  You will receive a reminder letter in the mail two months in advance. If you don't receive a letter, please call our office to schedule the follow-up appointment.     Any Other Special Instructions Will Be Listed Below (If Applicable).     If you need a refill on your cardiac medications before your next appointment, please call your pharmacy.      Signed, Sinclair Grooms, MD    11/08/2016 8:45 AM    [facility] [address], [place], [place]  [address] Phone: [phone]; Fax: [phone]

## 2016-11-22 ENCOUNTER — Ambulatory Visit (INDEPENDENT_AMBULATORY_CARE_PROVIDER_SITE_OTHER): Payer: BLUE CROSS/BLUE SHIELD | Admitting: Pharmacist

## 2016-11-22 ENCOUNTER — Encounter: Payer: Self-pay | Admitting: Pharmacist

## 2016-11-22 DIAGNOSIS — E785 Hyperlipidemia, unspecified: Secondary | ICD-10-CM

## 2016-11-22 NOTE — Progress Notes (Signed)
Patient ID: [name]                 DOB: [birth year]                    MRN: [id number]     HPI: [name] is a 51 y.o. female patient referred to lipid clinic by Dr. Tamala Julian. PMH is significant for CAD, LAD DES x 2 in 2012, hypertension, and hyperlipidemia. Pt has failed lipitor 10 mg (04/2012) due to severe muscle aches, and failed Crestor 5 mg qd (07/2013) due to severe muscle aches as well.  The side effects from Crestor started after taking it for just 3-4 days, and resolved a week or so after stopping it. Pt was enrolled in the [facility] trial October 2015. Pt took the last dose of study drug on 01/24/15. She last presented to lipid clinic in 02/18/2015 to discuss options. Due to lack of outcomes with Zetia monotherapy, PCSK9 was pursued, but [employer] [place] denied coverage because she had not tried Zetia. Pt agreed to take Zetia in 08/2015 and started vegetarian diet. LDL on 10/31/2015 was 67. Pt called in 02/2016 to report muscle and joint pain while on Zetia and was instructed to stop for up to 4 weeks and give follow up. She followed up with Dr. Tamala Julian in 11/08/2016. LDL was 108 on 11/06/16. She was referred to clinic to determine if PCSK9 is a consideration now.    Pt presents to clinic in good spirits. Pt states that Zetia caused muscle aches + joint pain and also elevated her liver enzymes. She stopped Zetia about 3 months after starting it. Has not been taking any antihyperlipidemics until she saw Dr. Tamala Julian on 11/08/16 when she was started on fish oil. Pt has been trying to control cholesterol on her own through diet and exercise. Now she would like to try PCSK9i in order to bring her LDL down to <70 mg/mL.    Current Medications: Fish oil 2 g daily  Intolerances: atorvastatin - aches in muscles/barely can move, Crestor 5 mg - severe muscle aches, Zetia 10 mg - muscle aches/joint pain, elevated liver enzymes.   Risk Factors: HTN, CAD, LAD DES x 2 in 2012   LDL goal: <70 mg/mL  Diet: Mostly vegetables  and fruits. Has been incorporating more chicken and fish into diet. Tries to avoid red meat. Also eats nuts and lentils.   Exercise: Has personal trainer and works with the trainer 3 days a week. On other days, she tries to do moderate intensity exercises on her own.    Family History: The patient's family history includes Heart disease in her father; Hypertension in her brother and sister; Ovarian cancer (age of onset: 84) in her mother.   Social History: Has never smoked. Denies alcohol or illicit drug use.   Labs: 11/06/16: TC 190, TG 75, HDL 67, VLDL 15, LDL 108 (No medications)  Past Medical History:  Diagnosis Date  . 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 Coronary artery disease    2 stents 2012  . Family history of early CAD   . Fibroids 2008  . Stented coronary artery 2012  . Wears glasses     Current Outpatient Prescriptions on File Prior to Visit  Medication Sig Dispense Refill  . aspirin 81 MG tablet Take 81 mg by mouth daily.    . Calcium Carbonate-Vitamin D (CALCIUM + D PO) Take 600 mg by mouth.     . ezetimibe (ZETIA) 10  MG tablet Take 1 tablet (10 mg total) by mouth daily. (Patient not taking: Reported on 11/08/2016) 30 tablet 6  . lisinopril (PRINIVIL,ZESTRIL) 5 MG tablet Take 2.5 mg by mouth daily.  1  . nitroGLYCERIN (NITROSTAT) 0.4 MG SL tablet Place 1 tablet (0.4 mg total) under the tongue every 5 (five) minutes as needed for chest pain. 25 tablet 3  . Omega-3 Fatty Acids (FISH OIL) 1000 MG CAPS Take 2 capsules (2,000 mg total) by mouth daily. 180 capsule 3   No current facility-administered medications on file prior to visit.     Allergies  Allergen Reactions  . Crestor [Rosuvastatin]     Severe muscle aches on Crestor 5 mg once daily  . Erythromycin Other (See Comments)    Stomach cramps  . Lipitor [Atorvastatin] Other (See Comments)    Aches in muscles/barely can move    Assessment/Plan:  1. Hyperlipidemia - Pt's most recent LDL from 11/06/16 is 108, still elevated  above her goal of < 70 mg/dL. Pt has failed trials of atorvastatin, Crestor, and Zetia due to myalgia and joint pain. Pt is interested in starting PCSK9 therapy. Discussed with pt the safety profile and efficacy of PCSK9 inhibitors. Provided education regarding injection technique as well. Will submit an application to pt's insurance for Praluent 75 mg/mL pen injectors. Will contact the pt when we hear back from insurance and plan to schedule f/u lipid and hepatic panel after 4th dose of Praluent.   -Barkley Boards, PharmD Student   Thank you, Lelan Pons. [facility], [place]  [address] [place], [place] [address]  Phone: [phone]; Fax: [phone] 11/22/2016 10:42 AM

## 2016-11-22 NOTE — Patient Instructions (Signed)
It was good seeing you today.  Your LDL cholesterol is 108 mg/dL.   To get you down to <70 mg/dL, we will submit an application to your insurance for Praluent 75 mg/mL pen injector, 1 injection every 14 days.  When we hear back from the insurance, we will contact you and send a prescription in.   Once you have the pens in hand, please call the clinic at [phone] to schedule lab work for after your 4th dose.   Continue with current diet and exercise regimen.   Contact the clinic with any questions you may have.

## 2016-11-26 ENCOUNTER — Other Ambulatory Visit: Payer: Self-pay | Admitting: Pharmacist

## 2016-11-26 MED ORDER — EVOLOCUMAB 140 MG/ML ~~LOC~~ SOAJ: 140.0000 mg | SUBCUTANEOUS | 11 refills | Status: DC

## 2016-11-26 NOTE — Telephone Encounter (Signed)
Spoke with patient and advised that Repatha (preferred on plan) approved through insurance. RX sent to pharmacy. Copay card mailed to patient at her request. Pt aware to call when medication obtained.

## 2016-12-05 ENCOUNTER — Telehealth: Payer: Self-pay | Admitting: Pharmacist

## 2016-12-05 NOTE — Telephone Encounter (Signed)
Pt called as has not heard from pharmacy. Provided her the phone number to call to set up shipment. She is aware to call once she receives medication so that labs can be ordered.

## 2016-12-07 NOTE — Telephone Encounter (Signed)
Spoke with patient and she reports that pharmacy is processing information and should have her set up next week. She is aware to call once she receives medication.

## 2016-12-12 NOTE — Telephone Encounter (Signed)
Pt called clinic stating she has received her Repatha injections in the mail. Counseled pt over the phone regarding injection technique. Pt will self-administer her first injection today. Scheduled f/u lab work in 6 weeks to assess efficacy of Repatha.

## 2017-02-08 ENCOUNTER — Other Ambulatory Visit: Payer: BLUE CROSS/BLUE SHIELD

## 2017-02-14 ENCOUNTER — Other Ambulatory Visit (INDEPENDENT_AMBULATORY_CARE_PROVIDER_SITE_OTHER): Payer: BLUE CROSS/BLUE SHIELD | Admitting: *Deleted

## 2017-02-14 DIAGNOSIS — E785 Hyperlipidemia, unspecified: Secondary | ICD-10-CM

## 2017-02-14 LAB — LIPID PANEL
CHOL/HDL RATIO: 1.9 ratio (ref 0.0–4.4)
Cholesterol, Total: 132 mg/dL (ref 100–199)
HDL: 70 mg/dL (ref >39)
LDL CALC: 52 mg/dL (ref 0–99)
TRIGLYCERIDES: 49 mg/dL (ref 0–149)
VLDL Cholesterol Cal: 10 mg/dL (ref 5–40)

## 2017-02-14 LAB — HEPATIC FUNCTION PANEL
ALBUMIN: 4.5 g/dL (ref 3.5–5.5)
ALT: 23 IU/L (ref 0–32)
AST: 51 IU/L — AB (ref 0–40)
Alkaline Phosphatase: 87 IU/L (ref 39–117)
BILIRUBIN TOTAL: 0.6 mg/dL (ref 0.0–1.2)
BILIRUBIN, DIRECT: 0.17 mg/dL (ref 0.00–0.40)
Total Protein: 7 g/dL (ref 6.0–8.5)

## 2017-04-10 ENCOUNTER — Telehealth: Payer: Self-pay | Admitting: Pharmacist

## 2017-04-10 NOTE — Telephone Encounter (Signed)
Pt called clinic to report she has been feeling thirsty and having some mild muscle aches - she is wondering if these symptoms are related to her Repatha. Repatha should not affect thirst but may be contributing to myalgias. Her PCP Dr Lorenda Hatchet checked labwork recently and was going to fax this over to [place]. Labs were checked last week per pt but we have not had any labs forwarded to [place] yet and pt states she has not heard about her results from PCP yet. Provided pt with our direct fax line in clinic and advised her to contact PCP regarding results as well.

## 2017-05-06 ENCOUNTER — Telehealth: Payer: Self-pay | Admitting: Pharmacist

## 2017-05-06 DIAGNOSIS — E785 Hyperlipidemia, unspecified: Secondary | ICD-10-CM

## 2017-05-06 NOTE — Telephone Encounter (Signed)
Im okay with the outlined approach. She may be running out of options. The outlined approach will help clarify.

## 2017-05-06 NOTE — Telephone Encounter (Addendum)
Pt reports that about a week after the Repatha injection she notices low back pain and tingling. This has been occurring with her last few injections. She is concerned because on her most recent blood work from PCP in early Jan 2019 her A1c was elevated and her CPK was also alittle above the normal range. She is very worried that Repatha is causing this. She would like to stop the medication. Discussed options including trial on alternate PCSK9i and clinical trial with bempedoic acid. She is willing to rechallenge with alternate PCSK9i - Praluent. She would like to repeat labs in 1 month to see if back to baseline off Repatha. If back at baseline she is willing to pursue Praluent.   Will repeat CPK, lipid and hepatic function in 1 month. Will ask Dr. Tamala Julian if ok to repeat A1c since patient is very concerned about this creeping up due to cholesterol treatment (patient aware that may be charged since less than 3 months from last A1c).

## 2017-05-12 ENCOUNTER — Other Ambulatory Visit: Payer: Self-pay

## 2017-05-12 ENCOUNTER — Emergency Department (HOSPITAL_BASED_OUTPATIENT_CLINIC_OR_DEPARTMENT_OTHER)
Admission: EM | Admit: 2017-05-12 | Discharge: 2017-05-12 | Disposition: A | Discharge status: Home / Self Care | Payer: BLUE CROSS/BLUE SHIELD | Attending: Emergency Medicine | Admitting: Emergency Medicine

## 2017-05-12 ENCOUNTER — Encounter (HOSPITAL_BASED_OUTPATIENT_CLINIC_OR_DEPARTMENT_OTHER): Payer: Self-pay | Admitting: *Deleted

## 2017-05-12 DIAGNOSIS — I2510 Atherosclerotic heart disease of native coronary artery without angina pectoris: Secondary | ICD-10-CM | POA: Insufficient documentation

## 2017-05-12 DIAGNOSIS — Z7982 Long term (current) use of aspirin: Secondary | ICD-10-CM | POA: Diagnosis not present

## 2017-05-12 DIAGNOSIS — I10 Essential (primary) hypertension: Secondary | ICD-10-CM | POA: Insufficient documentation

## 2017-05-12 DIAGNOSIS — R1013 Epigastric pain: Secondary | ICD-10-CM | POA: Diagnosis present

## 2017-05-12 DIAGNOSIS — K219 Gastro-esophageal reflux disease without esophagitis: Secondary | ICD-10-CM | POA: Insufficient documentation

## 2017-05-12 LAB — COMPREHENSIVE METABOLIC PANEL
ALBUMIN: 4.4 g/dL (ref 3.5–5.0)
ALK PHOS: 79 U/L (ref 38–126)
ALT: 22 U/L (ref 14–54)
ANION GAP: 9 (ref 5–15)
AST: 47 U/L — AB (ref 15–41)
BILIRUBIN TOTAL: 0.6 mg/dL (ref 0.3–1.2)
BUN: 14 mg/dL (ref 6–20)
CALCIUM: 9.9 mg/dL (ref 8.9–10.3)
CO2: 27 mmol/L (ref 22–32)
Chloride: 103 mmol/L (ref 101–111)
Creatinine, Ser: 0.9 mg/dL (ref 0.44–1.00)
GFR calc Af Amer: >60 mL/min (ref >60)
GFR calc non Af Amer: >60 mL/min (ref >60)
GLUCOSE: 93 mg/dL (ref 65–99)
POTASSIUM: 3.8 mmol/L (ref 3.5–5.1)
SODIUM: 139 mmol/L (ref 135–145)
TOTAL PROTEIN: 7.9 g/dL (ref 6.5–8.1)

## 2017-05-12 LAB — URINALYSIS, ROUTINE W REFLEX MICROSCOPIC
BILIRUBIN URINE: NEGATIVE
Glucose, UA: NEGATIVE mg/dL
Hgb urine dipstick: NEGATIVE
KETONES UR: 15 mg/dL — AB
Leukocytes, UA: NEGATIVE
Nitrite: NEGATIVE
PH: 6 (ref 5.0–8.0)
Protein, ur: NEGATIVE mg/dL
SPECIFIC GRAVITY, URINE: 1.015 (ref 1.005–1.030)

## 2017-05-12 LAB — LIPASE, BLOOD: Lipase: 56 U/L — ABNORMAL HIGH (ref 11–51)

## 2017-05-12 LAB — CBC
HEMATOCRIT: 41.5 % (ref 36.0–46.0)
HEMOGLOBIN: 13.7 g/dL (ref 12.0–15.0)
MCH: 29.8 pg (ref 26.0–34.0)
MCHC: 33 g/dL (ref 30.0–36.0)
MCV: 90.2 fL (ref 78.0–100.0)
Platelets: 195 10*3/uL (ref 150–400)
RBC: 4.6 MIL/uL (ref 3.87–5.11)
RDW: 12.7 % (ref 11.5–15.5)
WBC: 3.2 10*3/uL — ABNORMAL LOW (ref 4.0–10.5)

## 2017-05-12 LAB — PREGNANCY, URINE: Preg Test, Ur: NEGATIVE

## 2017-05-12 LAB — TROPONIN I: Troponin I: <0.03 ng/mL (ref <0.03)

## 2017-05-12 MED ORDER — GI COCKTAIL ~~LOC~~
30.0000 mL | Freq: Once | ORAL | Status: AC
  Administered 2017-05-12: 30 mL via ORAL
  Filled 2017-05-12: qty 30

## 2017-05-12 MED ORDER — FAMOTIDINE 20 MG PO TABS: 20.0000 mg | ORAL_TABLET | Freq: Two times a day (BID) | ORAL | 0 refills | Status: DC

## 2017-05-12 MED ORDER — FAMOTIDINE 20 MG PO TABS
20.0000 mg | ORAL_TABLET | Freq: Once | ORAL | Status: AC
  Administered 2017-05-12: 20 mg via ORAL
  Filled 2017-05-12: qty 1

## 2017-05-12 NOTE — ED Notes (Signed)
ED Provider at bedside. 

## 2017-05-12 NOTE — ED Triage Notes (Signed)
Pt reports "indigestion" since yesterday. Pain "comes and goes". States she feels in in her throat and epigastric area. Pt has hx of stent placement

## 2017-05-12 NOTE — ED Provider Notes (Signed)
[facility] EMERGENCY DEPARTMENT Provider Note   CSN: [id number] Arrival date & time: 05/12/17  1636     History   Chief Complaint Chief Complaint  Patient presents with  . Abdominal Pain    HPI [name] is a 52 y.o. female.  The history is provided by the patient. No language interpreter was used.  Abdominal Pain     [name] is a 52 y.o. female who presents to the Emergency Department complaining of epigastric pain/reflux.  She presents for evaluation of waxing and waning epigastric/chest pain.  She feels like it is indigestion and this vague discomfort.  Symptoms are mild in nature.  There are no clear alleviating or worsening factors.  No fevers, shortness of breath, cough, nausea, vomiting, leg swelling or pain.  She does have a history of coronary artery disease status post stents x2.  She takes a baby aspirin daily.  She has been taking Tums with no significant improvement in her symptoms.  She does endorse increased belching for the last few days.  She has been trying to eat better with more raw vegetables and nuts in her diet.  She denies any greasy or fatty food consumption.  No change in exercise tolerance. Past Medical History:  Diagnosis Date  . 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 Coronary artery disease    2 stents 2012  . Family history of early CAD   . Fibroids 2008  . Stented coronary artery 2012  . Wears glasses     Patient Active Problem List   Diagnosis Date Noted  . Hyperlipidemia 07/13/2014  . Essential hypertension, benign 02/04/2013  . Fibroids   . Coronary artery disease involving native coronary artery of native heart with angina pectoris ([place])   . Post-operative state 01/16/2013  . Sebaceous cyst 12/08/2012    Past Surgical History:  Procedure Laterality Date  . BREAST BIOPSY Right 12/30/2012   Procedure: CYST EXCISION BREAST;  Surgeon: Marcello Moores A. Cornett, MD;  Location: [place];  Service: General;  Laterality: Right;   . CARDIAC CATHETERIZATION  9/12   placed 2 stints    OB History    Gravida Para Term Preterm AB Living   3 2 2   1 2    SAB TAB Ectopic Multiple Live Births   1               Home Medications    Prior to Admission medications   Medication Sig Start Date End Date Taking? Authorizing Provider  aspirin 81 MG tablet Take 81 mg by mouth daily.   Yes [provider]  Evolocumab (REPATHA SURECLICK) 425 MG/ML SOAJ Inject 140 mg into the skin every 14 (fourteen) days. 11/26/16  Yes Belva Crome, MD  lisinopril (PRINIVIL,ZESTRIL) 5 MG tablet Take 2.5 mg by mouth daily. 08/13/15  Yes [provider]  Multiple Vitamin (MULTIVITAMIN) capsule Take 1 capsule by mouth daily.   Yes [provider]  nitroGLYCERIN (NITROSTAT) 0.4 MG SL tablet Place 1 tablet (0.4 mg total) under the tongue every 5 (five) minutes as needed for chest pain. 06/16/13  Yes Belva Crome, MD  Calcium Carbonate-Vitamin D (CALCIUM + D PO) Take 600 mg by mouth.     [provider]  famotidine (PEPCID) 20 MG tablet Take 1 tablet (20 mg total) by mouth 2 (two) times daily. 05/12/17   Quintella Reichert, MD  Omega-3 Fatty Acids (FISH OIL) 1000 MG CAPS Take 2 capsules (2,000 mg total) by  mouth daily. 11/08/16   Belva Crome, MD    Family History Family History  Problem Relation Age of Onset  . Heart disease Father   . Ovarian cancer Mother 62       ovarian sarcoma  . Hypertension Brother   . Hypertension Sister     Social History Social History   Tobacco Use  . Smoking status: Never Smoker  . Smokeless tobacco: Never Used  Substance Use Topics  . Alcohol use: No  . Drug use: No     Allergies   Crestor [rosuvastatin]; Erythromycin; and Lipitor [atorvastatin]   Review of Systems Review of Systems  Gastrointestinal: Positive for abdominal pain.  All other systems reviewed and are negative.    Physical Exam Updated Vital Signs BP (!) 168/95 (BP Location: Right Arm)   Pulse  64   Temp 98.4 F (36.9 C) (Oral)   Resp 16   Ht 5\' 5"  (1.651 m)   Wt 78.5 kg (173 lb)   SpO2 100%   BMI 28.79 kg/m   Physical Exam  Constitutional: She is oriented to person, place, and time. She appears well-developed and well-nourished.  HENT:  Head: Normocephalic and atraumatic.  Cardiovascular: Normal rate and regular rhythm.  No murmur heard. Pulmonary/Chest: Effort normal and breath sounds normal. No respiratory distress.  Abdominal: Soft. There is no tenderness. There is no rebound and no guarding.  Musculoskeletal: She exhibits no edema or tenderness.  Neurological: She is alert and oriented to person, place, and time.  Skin: Skin is warm and dry.  Psychiatric: She has a normal mood and affect. Her behavior is normal.  Nursing note and vitals reviewed.    ED Treatments / Results  Labs (all labs ordered are listed, but only abnormal results are displayed) Labs Reviewed  LIPASE, BLOOD - Abnormal; Notable for the following components:      Result Value   Lipase 56 (*)    All other components within normal limits  COMPREHENSIVE METABOLIC PANEL - Abnormal; Notable for the following components:   AST 47 (*)    All other components within normal limits  CBC - Abnormal; Notable for the following components:   WBC 3.2 (*)    All other components within normal limits  URINALYSIS, ROUTINE W REFLEX MICROSCOPIC - Abnormal; Notable for the following components:   Ketones, ur 15 (*)    All other components within normal limits  PREGNANCY, URINE  TROPONIN I    EKG  EKG Interpretation  Date/Time:  Sunday May 12 2017 16:48:02 EST Ventricular Rate:  76 PR Interval:  136 QRS Duration: 78 QT Interval:  376 QTC Calculation: 423 R Axis:   65 Text Interpretation:  Normal sinus rhythm Normal ECG Confirmed by Quintella Reichert [phone]) on 05/12/2017 5:03:08 PM       Radiology No results found.  Procedures Procedures (including critical care time)  Medications  Ordered in ED Medications  gi cocktail (Maalox,Lidocaine,Donnatal) (30 mLs Oral Given 05/12/17 2141)  famotidine (PEPCID) tablet 20 mg (20 mg Oral Given 05/12/17 2141)     Initial Impression / Assessment and Plan / ED Course  I have reviewed the triage vital signs and the nursing notes.  Pertinent labs & imaging results that were available during my care of the patient were reviewed by me and considered in my medical decision making (see chart for details).     Patient with history of coronary artery disease here for evaluation of epigastric pain/indigestion that is been waxing and  waning.  She is in no acute distress in the emergency department and EKG with no acute ischemic changes.  Current presentation is not consistent with ACS, PE, dissection, pneumonia.  Discussed with patient home care for reflux with outpatient follow-up and return precautions.  Final Clinical Impressions(s) / ED Diagnoses   Final diagnoses:  Gastroesophageal reflux disease without esophagitis    ED Discharge Orders        Ordered    famotidine (PEPCID) 20 MG tablet  2 times daily     05/12/17 2239       Quintella Reichert, MD 05/13/17 0128

## 2017-05-12 NOTE — ED Notes (Signed)
Pt advised still need urine sample. She will try to obtain

## 2017-06-03 ENCOUNTER — Other Ambulatory Visit: Payer: BLUE CROSS/BLUE SHIELD | Admitting: *Deleted

## 2017-06-03 DIAGNOSIS — E785 Hyperlipidemia, unspecified: Secondary | ICD-10-CM

## 2017-06-04 LAB — LIPID PANEL
Chol/HDL Ratio: 2.1 ratio (ref 0.0–4.4)
Cholesterol, Total: 119 mg/dL (ref 100–199)
HDL: 56 mg/dL (ref >39)
LDL Calculated: 54 mg/dL (ref 0–99)
TRIGLYCERIDES: 47 mg/dL (ref 0–149)
VLDL CHOLESTEROL CAL: 9 mg/dL (ref 5–40)

## 2017-06-04 LAB — HEPATIC FUNCTION PANEL
ALK PHOS: 83 IU/L (ref 39–117)
ALT: 19 IU/L (ref 0–32)
AST: 39 IU/L (ref 0–40)
Albumin: 4.3 g/dL (ref 3.5–5.5)
Bilirubin Total: 0.4 mg/dL (ref 0.0–1.2)
Bilirubin, Direct: 0.12 mg/dL (ref 0.00–0.40)
TOTAL PROTEIN: 6.6 g/dL (ref 6.0–8.5)

## 2017-06-04 LAB — HEMOGLOBIN A1C
ESTIMATED AVERAGE GLUCOSE: 123 mg/dL
HEMOGLOBIN A1C: 5.9 % — AB (ref 4.8–5.6)

## 2017-06-04 LAB — CK: CK TOTAL: 105 U/L (ref 24–173)

## 2017-06-28 ENCOUNTER — Other Ambulatory Visit (HOSPITAL_COMMUNITY): Payer: Self-pay | Admitting: Obstetrics and Gynecology

## 2017-06-28 DIAGNOSIS — N950 Postmenopausal bleeding: Secondary | ICD-10-CM

## 2017-07-04 ENCOUNTER — Ambulatory Visit (HOSPITAL_COMMUNITY)
Admission: RE | Admit: 2017-07-04 | Discharge: 2017-07-04 | Disposition: A | Discharge status: Home / Self Care | Payer: BLUE CROSS/BLUE SHIELD | Source: Ambulatory Visit | Attending: Obstetrics and Gynecology | Admitting: Obstetrics and Gynecology

## 2017-07-04 DIAGNOSIS — N950 Postmenopausal bleeding: Secondary | ICD-10-CM

## 2017-07-04 DIAGNOSIS — D251 Intramural leiomyoma of uterus: Secondary | ICD-10-CM | POA: Insufficient documentation

## 2017-07-04 DIAGNOSIS — N859 Noninflammatory disorder of uterus, unspecified: Secondary | ICD-10-CM | POA: Insufficient documentation

## 2017-07-11 ENCOUNTER — Other Ambulatory Visit: Payer: Self-pay | Admitting: Obstetrics & Gynecology

## 2017-08-08 ENCOUNTER — Other Ambulatory Visit: Payer: Self-pay | Admitting: Internal Medicine

## 2017-08-08 DIAGNOSIS — K769 Liver disease, unspecified: Secondary | ICD-10-CM

## 2017-08-17 ENCOUNTER — Ambulatory Visit
Admission: RE | Admit: 2017-08-17 | Discharge: 2017-08-17 | Disposition: A | Discharge status: Home / Self Care | Payer: BLUE CROSS/BLUE SHIELD | Source: Ambulatory Visit | Attending: Internal Medicine | Admitting: Internal Medicine

## 2017-08-17 DIAGNOSIS — K769 Liver disease, unspecified: Secondary | ICD-10-CM

## 2017-08-17 MED ORDER — GADOBENATE DIMEGLUMINE 529 MG/ML IV SOLN
15.0000 mL | Freq: Once | INTRAVENOUS | Status: AC | PRN
  Administered 2017-08-17: 15 mL via INTRAVENOUS

## 2017-08-30 ENCOUNTER — Other Ambulatory Visit: Payer: Self-pay | Admitting: Obstetrics & Gynecology

## 2017-09-19 ENCOUNTER — Encounter (HOSPITAL_BASED_OUTPATIENT_CLINIC_OR_DEPARTMENT_OTHER): Payer: Self-pay

## 2017-09-20 ENCOUNTER — Other Ambulatory Visit: Payer: Self-pay

## 2017-09-20 ENCOUNTER — Encounter (HOSPITAL_BASED_OUTPATIENT_CLINIC_OR_DEPARTMENT_OTHER): Payer: Self-pay | Admitting: *Deleted

## 2017-09-20 NOTE — Progress Notes (Signed)
Spoke w/ pt via phone for pre-op interview.  Npo after mn.  Arrive at [facility].  Needs istat.  Current ekg in chart and epic.

## 2017-09-21 NOTE — H&P (Signed)
[name] is an 52 y.o. female with postmenopausal bleeding and found to have an endometrial lesion, suspect fibroid, here for dilation and currettage hysteroscopy with myosure.    Pertinent Gynecological History: Menses: post-menopausal Bleeding: post menopausal bleeding Contraception: none DES exposure: unknown Blood transfusions: none Sexually transmitted diseases: no past history Previous GYN Procedures: endometrial biopsy  Last mammogram: normal Date:  02/08/2017 Last pap: normal Date: 04/05/2015 OB History: G3, P2012   Menstrual History: Patient's last menstrual period was 09/21/2013.    Past Medical History:  Diagnosis Date  . Coronary artery disease cardiologist-  dr Daneen Schick   09-26-2010  cardiac cath w/ PCI and DES x2 to LAD  . Family history of early CAD   . Fatty liver   . PMB (postmenopausal bleeding)   . S/P drug eluting coronary stent placement 09/26/2010   x2 to LAD  . Uterine leiomyoma   . Wears glasses     Past Surgical History:  Procedure Laterality Date  . BREAST BIOPSY Right 12/30/2012   Procedure: CYST EXCISION BREAST;  Surgeon: Marcello Moores A. Cornett, MD;  Location: [place];  Service: General;  Laterality: Right;  . COLONOSCOPY WITH PROPOFOL  2016  . CORONARY ANGIOPLASTY WITH STENT PLACEMENT  09-26-2010   dr Daneen Schick   unstable angina w/ exertion & abnormal ETT-- PCI and DES to midLAD and distalLAD (x2 stents),  normal LVF, ef 70%    Family History  Problem Relation Age of Onset  . Heart disease Father   . Ovarian cancer Mother 41       ovarian sarcoma  . Hypertension Brother   . Hypertension Sister     Social History:  reports that she has never smoked. She has never used smokeless tobacco. She reports that she does not drink alcohol or use drugs.  Allergies:  Allergies  Allergen Reactions  . Crestor [Rosuvastatin]     Severe muscle aches on Crestor 5 mg once daily  . Erythromycin Other (See Comments)    Stomach  cramps  . Lipitor [Atorvastatin] Other (See Comments)    Aches in muscles/barely can move    No medications prior to admission.    ROS  Constitutional: Denies fevers/chills Cardiovascular: Denies chest pain or palpitations Pulmonary: Denies coughing or wheezing Gastrointestinal: Denies nausea, vomiting or diarrhea Genitourinary: Denies pelvic pain, unusual vaginal bleeding, unusual vaginal discharge, dysuria, urgency or frequency.  Musculoskeletal: Denies muscle or joint aches and pain.  Neurology: Denies abnormal sensations such as tingling or numbness.   Height 5\' 5"  (1.651 m), weight 78.5 kg (173 lb), last menstrual period 09/21/2013. Physical Exam Constitutional: She is oriented to person, place, and time. She appears well-developed and well-nourished.  HENT:  Head: Normocephalic and atraumatic.  Neck: Normal range of motion.  Cardiovascular: Normal rate, regular rhythm and normal heart sounds.   Respiratory: Effort normal and breath sounds normal.  GI: Soft. Bowel sounds are normal.  Neurological: She is alert and oriented to person, place, and time.  Skin: Skin is warm and dry.  Psychiatric: She has a normal mood and affect. Her behavior is normal.   CBC    Component Value Date/Time   WBC 3.2 (L) 05/12/2017 1722   RBC 4.60 05/12/2017 1722   HGB 13.7 05/12/2017 1722   HCT 41.5 05/12/2017 1722   PLT 195 05/12/2017 1722   MCV 90.2 05/12/2017 1722   MCH 29.8 05/12/2017 1722   MCHC 33.0 05/12/2017 1722   RDW 12.7 05/12/2017 1722  LYMPHSABS 1.2 10/18/2013 1840   MONOABS 0.1 10/18/2013 1840   EOSABS 0.1 10/18/2013 1840   BASOSABS 0.0 10/18/2013 1840   Endometrial biopsy 07/11/2017: Atrophic endometrium.  No dysplasia or malignancy.    Pelvic Ultrasound 07/04/17: Uterus 9.4 X 6.1 x 6.2 cm.  1.7 cm intramural posterior fibroid. 1.8 cm intramural posterior fundal fibroid. 1.8 cm posterior fundal fibroid.  2.6 cm X 2.5 cm intramural anterior fundal fibroid.  2.9 cm x 2.6 cm  x 2.8 cm heterogenous mass with intermittent areas of posterior acoustic shadowing, displaces endometrium, may be a sub-musocal fibroid.  Normal left and right ovaries.    Assessment/Plan: 52 y/o P2 with postmenopausal bleeding, found to have an endometrial lesion, suspect a fibroid - Admit to Day surgery for D & C hysteroscopy, Myosure.  - NPO and IVFluids - This procedure has been fully reviewed with the patient and written informed consent has been obtained. Alinda Dooms, MD.   09/21/2017, 1:55 PM

## 2017-09-26 ENCOUNTER — Encounter (HOSPITAL_BASED_OUTPATIENT_CLINIC_OR_DEPARTMENT_OTHER): Payer: Self-pay

## 2017-09-26 ENCOUNTER — Ambulatory Visit (HOSPITAL_BASED_OUTPATIENT_CLINIC_OR_DEPARTMENT_OTHER): Payer: BLUE CROSS/BLUE SHIELD | Admitting: Anesthesiology

## 2017-09-26 ENCOUNTER — Encounter (HOSPITAL_BASED_OUTPATIENT_CLINIC_OR_DEPARTMENT_OTHER)
Admission: RE | Disposition: A | Discharge status: Home / Self Care | Payer: Self-pay | Source: Ambulatory Visit | Attending: Obstetrics & Gynecology

## 2017-09-26 ENCOUNTER — Ambulatory Visit (HOSPITAL_BASED_OUTPATIENT_CLINIC_OR_DEPARTMENT_OTHER)
Admission: RE | Admit: 2017-09-26 | Discharge: 2017-09-26 | Disposition: A | Discharge status: Home / Self Care | Payer: BLUE CROSS/BLUE SHIELD | Source: Ambulatory Visit | Attending: Obstetrics & Gynecology | Admitting: Obstetrics & Gynecology

## 2017-09-26 DIAGNOSIS — Z955 Presence of coronary angioplasty implant and graft: Secondary | ICD-10-CM | POA: Insufficient documentation

## 2017-09-26 DIAGNOSIS — D251 Intramural leiomyoma of uterus: Secondary | ICD-10-CM | POA: Insufficient documentation

## 2017-09-26 DIAGNOSIS — I10 Essential (primary) hypertension: Secondary | ICD-10-CM | POA: Diagnosis not present

## 2017-09-26 DIAGNOSIS — Z881 Allergy status to other antibiotic agents status: Secondary | ICD-10-CM | POA: Diagnosis not present

## 2017-09-26 DIAGNOSIS — Z888 Allergy status to other drugs, medicaments and biological substances status: Secondary | ICD-10-CM | POA: Diagnosis not present

## 2017-09-26 DIAGNOSIS — K760 Fatty (change of) liver, not elsewhere classified: Secondary | ICD-10-CM | POA: Diagnosis not present

## 2017-09-26 DIAGNOSIS — I2510 Atherosclerotic heart disease of native coronary artery without angina pectoris: Secondary | ICD-10-CM | POA: Diagnosis not present

## 2017-09-26 DIAGNOSIS — Z8249 Family history of ischemic heart disease and other diseases of the circulatory system: Secondary | ICD-10-CM | POA: Insufficient documentation

## 2017-09-26 DIAGNOSIS — N950 Postmenopausal bleeding: Secondary | ICD-10-CM | POA: Diagnosis not present

## 2017-09-26 HISTORY — PX: DILATATION & CURETTAGE/HYSTEROSCOPY WITH MYOSURE: SHX6511

## 2017-09-26 HISTORY — DX: Postmenopausal bleeding: N95.0

## 2017-09-26 HISTORY — DX: Leiomyoma of uterus, unspecified: D25.9

## 2017-09-26 HISTORY — DX: Presence of coronary angioplasty implant and graft: Z95.5

## 2017-09-26 HISTORY — DX: Fatty (change of) liver, not elsewhere classified: K76.0

## 2017-09-26 LAB — POCT I-STAT 4, (NA,K, GLUC, HGB,HCT)
Glucose, Bld: 88 mg/dL (ref 70–99)
HCT: 39 % (ref 36.0–46.0)
Hemoglobin: 13.3 g/dL (ref 12.0–15.0)
Potassium: 4.5 mmol/L (ref 3.5–5.1)
Sodium: 143 mmol/L (ref 135–145)

## 2017-09-26 SURGERY — DILATATION & CURETTAGE/HYSTEROSCOPY WITH MYOSURE
Anesthesia: General

## 2017-09-26 MED ORDER — FENTANYL CITRATE (PF) 100 MCG/2ML IJ SOLN
INTRAMUSCULAR | Status: DC | PRN
  Administered 2017-09-26: 50 ug via INTRAVENOUS

## 2017-09-26 MED ORDER — ACETAMINOPHEN 500 MG PO TABS
1000.0000 mg | ORAL_TABLET | Freq: Once | ORAL | Status: AC
  Administered 2017-09-26: 1000 mg via ORAL
  Filled 2017-09-26: qty 2

## 2017-09-26 MED ORDER — DEXAMETHASONE SODIUM PHOSPHATE 4 MG/ML IJ SOLN
INTRAMUSCULAR | Status: DC | PRN
  Administered 2017-09-26: 10 mg via INTRAVENOUS

## 2017-09-26 MED ORDER — ONDANSETRON HCL 4 MG/2ML IJ SOLN
INTRAMUSCULAR | Status: DC | PRN
  Administered 2017-09-26: 4 mg via INTRAVENOUS

## 2017-09-26 MED ORDER — MIDAZOLAM HCL 5 MG/5ML IJ SOLN
INTRAMUSCULAR | Status: DC | PRN
  Administered 2017-09-26: 2 mg via INTRAVENOUS

## 2017-09-26 MED ORDER — HYDROMORPHONE HCL 1 MG/ML IJ SOLN
0.2500 mg | INTRAMUSCULAR | Status: DC | PRN
  Administered 2017-09-26: 0.5 mg via INTRAVENOUS
  Filled 2017-09-26: qty 0.5

## 2017-09-26 MED ORDER — CELECOXIB 200 MG PO CAPS
ORAL_CAPSULE | ORAL | Status: AC
  Filled 2017-09-26: qty 1

## 2017-09-26 MED ORDER — LIDOCAINE 2% (20 MG/ML) 5 ML SYRINGE
INTRAMUSCULAR | Status: DC | PRN
  Administered 2017-09-26: 100 mg via INTRAVENOUS

## 2017-09-26 MED ORDER — DEXAMETHASONE SODIUM PHOSPHATE 10 MG/ML IJ SOLN
INTRAMUSCULAR | Status: AC
  Filled 2017-09-26: qty 1

## 2017-09-26 MED ORDER — PROMETHAZINE HCL 25 MG/ML IJ SOLN
6.2500 mg | INTRAMUSCULAR | Status: DC | PRN
  Filled 2017-09-26: qty 1

## 2017-09-26 MED ORDER — MIDAZOLAM HCL 2 MG/2ML IJ SOLN
INTRAMUSCULAR | Status: AC
  Filled 2017-09-26: qty 2

## 2017-09-26 MED ORDER — KETOROLAC TROMETHAMINE 30 MG/ML IJ SOLN
INTRAMUSCULAR | Status: AC
  Filled 2017-09-26: qty 1

## 2017-09-26 MED ORDER — OXYCODONE HCL 5 MG PO TABS
5.0000 mg | ORAL_TABLET | Freq: Once | ORAL | Status: DC | PRN
  Filled 2017-09-26: qty 1

## 2017-09-26 MED ORDER — LACTATED RINGERS IV SOLN
INTRAVENOUS | Status: DC
  Administered 2017-09-26 (×2): via INTRAVENOUS
  Filled 2017-09-26: qty 1000

## 2017-09-26 MED ORDER — LIDOCAINE 2% (20 MG/ML) 5 ML SYRINGE
INTRAMUSCULAR | Status: AC
  Filled 2017-09-26: qty 5

## 2017-09-26 MED ORDER — FENTANYL CITRATE (PF) 100 MCG/2ML IJ SOLN
INTRAMUSCULAR | Status: AC
  Filled 2017-09-26: qty 2

## 2017-09-26 MED ORDER — PROPOFOL 10 MG/ML IV BOLUS
INTRAVENOUS | Status: DC | PRN
  Administered 2017-09-26: 150 mg via INTRAVENOUS

## 2017-09-26 MED ORDER — ACETAMINOPHEN 160 MG/5ML PO SOLN
960.0000 mg | Freq: Once | ORAL | Status: AC
  Filled 2017-09-26: qty 40.6

## 2017-09-26 MED ORDER — HYDROMORPHONE HCL 1 MG/ML IJ SOLN
0.2500 mg | INTRAMUSCULAR | Status: DC | PRN
  Filled 2017-09-26: qty 0.5

## 2017-09-26 MED ORDER — IBUPROFEN 600 MG PO TABS: 600.0000 mg | ORAL_TABLET | Freq: Four times a day (QID) | ORAL | 0 refills | Status: DC | PRN

## 2017-09-26 MED ORDER — ACETAMINOPHEN 500 MG PO TABS
ORAL_TABLET | ORAL | Status: AC
  Filled 2017-09-26: qty 2

## 2017-09-26 MED ORDER — ONDANSETRON HCL 4 MG/2ML IJ SOLN
INTRAMUSCULAR | Status: AC
  Filled 2017-09-26: qty 2

## 2017-09-26 MED ORDER — OXYCODONE-ACETAMINOPHEN 5-325 MG PO TABS: 1.0000 | ORAL_TABLET | ORAL | 0 refills | Status: AC | PRN

## 2017-09-26 MED ORDER — PROPOFOL 10 MG/ML IV BOLUS
INTRAVENOUS | Status: AC
  Filled 2017-09-26: qty 40

## 2017-09-26 MED ORDER — SODIUM CHLORIDE 0.9 % IR SOLN
Status: DC | PRN
  Administered 2017-09-26 (×3): 3000 mL

## 2017-09-26 MED ORDER — HYDROMORPHONE HCL 1 MG/ML IJ SOLN
INTRAMUSCULAR | Status: AC
  Filled 2017-09-26: qty 1

## 2017-09-26 MED ORDER — OXYCODONE HCL 5 MG/5ML PO SOLN
5.0000 mg | Freq: Once | ORAL | Status: DC | PRN
  Filled 2017-09-26: qty 5

## 2017-09-26 MED ORDER — CELECOXIB 200 MG PO CAPS
200.0000 mg | ORAL_CAPSULE | Freq: Once | ORAL | Status: AC | PRN
  Administered 2017-09-26: 200 mg via ORAL
  Filled 2017-09-26: qty 1

## 2017-09-26 MED ORDER — ARTIFICIAL TEARS OPHTHALMIC OINT
TOPICAL_OINTMENT | OPHTHALMIC | Status: AC
  Filled 2017-09-26: qty 3.5

## 2017-09-26 MED ORDER — BUPIVACAINE-EPINEPHRINE 0.5% -1:200000 IJ SOLN
INTRAMUSCULAR | Status: DC | PRN
  Administered 2017-09-26: 10 mL

## 2017-09-26 MED ORDER — PHENYLEPHRINE 40 MCG/ML (10ML) SYRINGE FOR IV PUSH (FOR BLOOD PRESSURE SUPPORT)
PREFILLED_SYRINGE | INTRAVENOUS | Status: DC | PRN
  Administered 2017-09-26: 80 ug via INTRAVENOUS

## 2017-09-26 SURGICAL SUPPLY — 14 items
CANISTER SUCT 3000ML PPV (MISCELLANEOUS) ×2 IMPLANT
CATH ROBINSON RED A/P 16FR (CATHETERS) ×2 IMPLANT
DEVICE MYOSURE LITE (MISCELLANEOUS) IMPLANT
DEVICE MYOSURE REACH (MISCELLANEOUS) ×2 IMPLANT
GLOVE BIOGEL PI IND STRL 7.0 (GLOVE) ×2 IMPLANT
GLOVE BIOGEL PI INDICATOR 7.0 (GLOVE) ×2
GLOVE SURG SS PI 6.5 STRL IVOR (GLOVE) ×2 IMPLANT
GOWN STRL REUS W/TWL LRG LVL3 (GOWN DISPOSABLE) ×4 IMPLANT
PACK VAGINAL MINOR WOMEN LF (CUSTOM PROCEDURE TRAY) ×2 IMPLANT
PAD OB MATERNITY 4.3X12.25 (PERSONAL CARE ITEMS) ×2 IMPLANT
SEAL ROD LENS SCOPE MYOSURE (ABLATOR) ×2 IMPLANT
TOWEL OR 17X24 6PK STRL BLUE (TOWEL DISPOSABLE) ×4 IMPLANT
TUBING AQUILEX INFLOW (TUBING) ×2 IMPLANT
TUBING AQUILEX OUTFLOW (TUBING) ×2 IMPLANT

## 2017-09-26 NOTE — Op Note (Signed)
Patient: [name], [name] DOB: 04/13/1995 MRN: [id number]  PREOP DIAGNOSIS:  1.Postmenopausal bleeding.  2.Endometrial lesion suspect fibroids.       Post operative diagnosis: Same as above  Procedures: Dilation and Currettage hysteroscopy with Myosure.   Surgeon: Dr. Waymon Amato  Assistant: Scrub technician- Ilda Mori  Anesthesiologist: General anesthesia- LMA  Complications: None  Fluid deficit: 2300 cc  Urine: 25 cc straight catheterization  EBL: 10 cc  Indications: 52 y/o P2 with a history of postmenopausal bleeding and endometrial lesion found on ultrasound, suspect uterine fibroid here for dilation and currettage hysteroscopy with myosure.  An office biopsy had shown atrophic endometrium, no dysplasia or hyperplasia.  She was taken to the operating room for removal of endometrial lesion.      Procedure:  Informed consent was obtained from the patient to undergo the procedure including risks of bleeding, infection and damage to organs.  She was taken to the operating room and anesthesia was administered without difficulty. An exam was then performed under anesthesia revealing a 10 week sized uterus and a closed cervix. There were no palpable adnexal masses.  She was prepped and draped in the usual sterile fashion. She was straight catheterized.  A graves speculum was used to view the cervix. Single-tooth tenaculum was placed on the cervix. The cervix was dilated to #19 pratt dilators and the hysteroscope was advanced through the internal cervical os. The uterine cavity and uterus was noted to have a 4cm submucosal lesion on her left posterior uterine wall.  This lesion was shaved off with the Myosure REACH and then regular MYOSURE for better resection under direct visualization without any complications. Sharp currettage was then performed. The tubal ostia were noted during the procedure.  No other lesions were noted.  All instruments were then removed and the patient was awoken from  anesthesia and taken to recovery room in stable condition  Specimen: Endometrial lesion suspect fibroid, endometrial currettings.    Disposition: Stable to PACU.

## 2017-09-26 NOTE — Anesthesia Preprocedure Evaluation (Addendum)
Anesthesia Evaluation  Patient identified by MRN, date of birth, ID band Patient awake    Reviewed: Allergy & Precautions, NPO status , Patient's Chart, lab work & pertinent test results  Airway Mallampati: II  TM Distance: >3 FB Neck ROM: Full    Dental no notable dental hx.    Pulmonary neg pulmonary ROS,    Pulmonary exam normal breath sounds clear to auscultation       Cardiovascular Exercise Tolerance: Good hypertension, Pt. on medications + CAD and + Cardiac Stents (x 2 in 2012)  Normal cardiovascular exam Rhythm:Regular Rate:Normal  ECG: NSR, rate 76  Sees cardiologist Tamala Julian)   Neuro/Psych negative neurological ROS  negative psych ROS   GI/Hepatic negative GI ROS, Neg liver ROS,   Endo/Other  negative endocrine ROS  Renal/GU negative Renal ROS     Musculoskeletal negative musculoskeletal ROS (+)   Abdominal   Peds  Hematology negative hematology ROS (+)   Anesthesia Other Findings Post-menopausal Bleeding  Reproductive/Obstetrics Uterine leiomyoma                            Anesthesia Physical Anesthesia Plan  ASA: III  Anesthesia Plan: General   Post-op Pain Management:    Induction: Intravenous  PONV Risk Score and Plan: 3 and Midazolam, Ondansetron, Dexamethasone and Treatment may vary due to age or medical condition  Airway Management Planned: LMA  Additional Equipment:   Intra-op Plan:   Post-operative Plan: Extubation in OR  Informed Consent: I have reviewed the patients History and Physical, chart, labs and discussed the procedure including the risks, benefits and alternatives for the proposed anesthesia with the patient or authorized representative who has indicated his/her understanding and acceptance.   Dental advisory given  Plan Discussed with: CRNA  Anesthesia Plan Comments:        Anesthesia Quick Evaluation

## 2017-09-26 NOTE — Interval H&P Note (Signed)
History and Physical Interval Note:  09/26/2017 8:37 AM  [facility]  has presented today for surgery, with the diagnosis of Post-menopausal Bleeding  The various methods of treatment have been discussed with the patient and family. After consideration of risks, benefits and other options for treatment, the patient has consented to  Procedure(s): [place] (N/A) as a surgical intervention .  The patient's history has been reviewed, patient examined, no change in status, stable for surgery.  I have reviewed the patient's chart and labs.  Questions were answered to the patient's satisfaction.   CBC    Component Value Date/Time   WBC 3.2 (L) 05/12/2017 1722   RBC 4.60 05/12/2017 1722   HGB 13.3 09/26/2017 0715   HCT 39.0 09/26/2017 0715   PLT 195 05/12/2017 1722   MCV 90.2 05/12/2017 1722   MCH 29.8 05/12/2017 1722   MCHC 33.0 05/12/2017 1722   RDW 12.7 05/12/2017 1722   LYMPHSABS 1.2 10/18/2013 1840   MONOABS 0.1 10/18/2013 1840   EOSABS 0.1 10/18/2013 1840   BASOSABS 0.0 10/18/2013 1840   Garfield Coiner WAKURU, MD.

## 2017-09-26 NOTE — Transfer of Care (Signed)
  Last Vitals:  Vitals Value Taken Time  BP 139/81 09/26/2017 10:08 AM  Temp    Pulse 67 09/26/2017 10:09 AM  Resp 13 09/26/2017 10:10 AM  SpO2 100 % 09/26/2017 10:09 AM  Vitals shown include unvalidated device data.  Last Pain:  Vitals:   09/26/17 0658  TempSrc:   PainSc: 0-No pain      Patients Stated Pain Goal: 8 (09/26/17 4680)  Immediate Anesthesia Transfer of Care Note  Patient: [name]  Procedure(s) Performed: Procedure(s) (LRB): DILATATION & CURETTAGE/HYSTEROSCOPY WITH MYOSURE (N/A)  Patient Location: PACU  Anesthesia Type: General  Level of Consciousness: awake, alert  and oriented  Airway & Oxygen Therapy: Patient Spontanous Breathing and Patient connected to nasal cannula oxygen  Post-op Assessment: Report given to PACU RN and Post -op Vital signs reviewed and stable  Post vital signs: Reviewed and stable  Complications: No apparent anesthesia complications

## 2017-09-26 NOTE — Discharge Instructions (Signed)

## 2017-09-26 NOTE — Anesthesia Postprocedure Evaluation (Signed)
Anesthesia Post Note  Patient: [name]  Procedure(s) Performed: DILATATION & CURETTAGE/HYSTEROSCOPY WITH MYOSURE (N/A )     Patient location during evaluation: PACU Anesthesia Type: General Level of consciousness: awake and alert Pain management: pain level controlled Vital Signs Assessment: post-procedure vital signs reviewed and stable Respiratory status: spontaneous breathing, nonlabored ventilation, respiratory function stable and patient connected to nasal cannula oxygen Cardiovascular status: blood pressure returned to baseline and stable Postop Assessment: no apparent nausea or vomiting Anesthetic complications: no    Last Vitals:  Vitals:   09/26/17 1130 09/26/17 1210  BP:  129/84  Pulse: 64 60  Resp: 16 16  Temp: 36.4 C 36.8 C  SpO2: 100% 100%    Last Pain:  Vitals:   09/26/17 1215  TempSrc:   PainSc: 0-No pain                 Galena Logie P Evaleen Sant

## 2017-09-26 NOTE — Brief Op Note (Signed)
09/26/2017  1:02 PM  PATIENT:  [name]  52 y.o. female  PRE-OPERATIVE DIAGNOSIS:  Post-menopausal Bleeding  POST-OPERATIVE DIAGNOSIS:  Post-menopausal Bleeding  PROCEDURE:  Procedure(s): DILATATION & CURETTAGE/HYSTEROSCOPY WITH MYOSURE (N/A)  SURGEON:  Surgeon(s) and Role:    * Waymon Amato, MD - Primary  ASSISTANTS: Scrub tech- [place]   ANESTHESIA:   general  EBL:  10 mL   BLOOD ADMINISTERED:none  DRAINS: none   LOCAL MEDICATIONS USED:  MARCAINE with epinephrine and Amount: 10  ml  SPECIMEN:  Source of Specimen:  Endometrial lesion. Endometrial currettings.   DISPOSITION OF SPECIMEN:  PATHOLOGY  COUNTS:  YES  TOURNIQUET:  * No tourniquets in log *  DICTATION: .Note written in [place]: Discharge to home after PACU  PATIENT DISPOSITION:  PACU - hemodynamically stable.   Delay start of Pharmacological VTE agent (>24hrs) due to surgical blood loss or risk of bleeding: not applicable

## 2017-09-26 NOTE — Procedures (Signed)
Patient: Cole, Klugh DOB: 04/13/1995 MRN: [id number]  PREOP DIAGNOSIS:  1.Postmenopausal bleeding.  2.Endometrial lesion suspect fibroids.       Post operative diagnosis: Same as above  Procedures: Dilation and Currettage hysteroscopy with Myosure.   Surgeon: Dr. Waymon Amato  Assistant: Scrub technician- Ilda Mori  Anesthesiologist: General anesthesia- LMA  Complications: None  Fluid deficit: 2300 cc  Urine: 25 cc straight catheterization  EBL: 10 cc  Indications: 52 y/o P2 with a history of postmenopausal bleeding and endometrial lesion found on ultrasound, suspect uterine fibroid here for dilation and currettage hysteroscopy with myosure.  An office biopsy had shown atrophic endometrium, no dysplasia or hyperplasia.  She was taken to the operating room for removal of endometrial lesion.      Procedure:  Informed consent was obtained from the patient to undergo the procedure including risks of bleeding, infection and damage to organs.  She was taken to the operating room and anesthesia was administered without difficulty. An exam was then performed under anesthesia revealing a 10 week sized uterus and a closed cervix. There were no palpable adnexal masses.  She was prepped and draped in the usual sterile fashion. She was straight catheterized.  A graves speculum was used to view the cervix. Single-tooth tenaculum was placed on the cervix. The cervix was dilated to #19 pratt dilators and the hysteroscope was advanced through the internal cervical os. The uterine cavity and uterus was noted to have a 4cm submucosal lesion on her left posterior uterine wall.  This lesion was shaved off with the Myosure REACH and then regular MYOSURE for better resection under direct visualization without any complications. Sharp currettage was then performed. The tubal ostia were noted during the procedure.  No other lesions were noted.  All instruments were then removed and the patient was awoken from  anesthesia and taken to recovery room in stable condition  Specimen: Endometrial lesion suspect fibroid, endometrial currettings.    Disposition: Stable to PACU.

## 2017-09-26 NOTE — Anesthesia Procedure Notes (Signed)
Procedure Name: LMA Insertion Date/Time: 09/26/2017 8:43 AM Performed by: Murvin Natal, MD Pre-anesthesia Checklist: Patient identified, Emergency Drugs available, Suction available and Patient being monitored Patient Re-evaluated:Patient Re-evaluated prior to induction Oxygen Delivery Method: Circle system utilized Preoxygenation: Pre-oxygenation with 100% oxygen Induction Type: IV induction Ventilation: Mask ventilation without difficulty LMA: LMA inserted LMA Size: 4.0 Number of attempts: 1 Airway Equipment and Method: Bite block Placement Confirmation: positive ETCO2 Tube secured with: Tape Dental Injury: Teeth and Oropharynx as per pre-operative assessment

## 2017-09-27 ENCOUNTER — Encounter (HOSPITAL_BASED_OUTPATIENT_CLINIC_OR_DEPARTMENT_OTHER): Payer: Self-pay | Admitting: Obstetrics & Gynecology

## 2017-10-30 ENCOUNTER — Encounter

## 2017-11-14 NOTE — Progress Notes (Signed)
Cardiology Office Note:    Date:  11/15/2017   ID:  [name], DOB 04/06/1965, MRN [id number]  PCP:  Wenda Low, MD  Cardiologist:  No primary care provider on file.   Referring MD: Wenda Low, MD   Chief Complaint  Patient presents with  . Coronary Artery Disease  . Hypertension    History of Present Illness:    [name] is a 52 y.o. female with a hx of CAD, LAD DES 2 in 2012, hypertension, and hyperlipidemia.  In the interval since last being seen she has had multiple medical problems including abdominal pain requiring an extensive work-up and eventually identifying kidney stones and fibroids.  Despite the above-mentioned complaints, she has had no cardiac symptoms.  No angina but occasional palpitations.  She has no longer on therapy for her lipids.  Was on Repatha at one point and states that she ran Dr. Deforest Hoyles discontinued the medication because of elevated CPK.  Past Medical History:  Diagnosis Date  . Coronary artery disease cardiologist-  dr Daneen Schick   09-26-2010  cardiac cath w/ PCI and DES x2 to LAD  . Family history of early CAD   . Fatty liver   . PMB (postmenopausal bleeding)   . S/P drug eluting coronary stent placement 09/26/2010   x2 to LAD  . Uterine leiomyoma   . Wears glasses     Past Surgical History:  Procedure Laterality Date  . BREAST BIOPSY Right 12/30/2012   Procedure: CYST EXCISION BREAST;  Surgeon: Marcello Moores A. Cornett, MD;  Location: [facility];  Service: General;  Laterality: Right;  . COLONOSCOPY WITH PROPOFOL  2016  . CORONARY ANGIOPLASTY WITH STENT PLACEMENT  09-26-2010   dr Daneen Schick   unstable angina w/ exertion & abnormal ETT-- PCI and DES to midLAD and distalLAD (x2 stents),  normal LVF, ef 70%  . DILATATION & CURETTAGE/HYSTEROSCOPY WITH MYOSURE N/A 09/26/2017   Procedure: DILATATION & CURETTAGE/HYSTEROSCOPY WITH MYOSURE;  Surgeon: Waymon Amato, MD;  Location: [facility];  Service:  Gynecology;  Laterality: N/A;    Current Medications: Current Meds  Medication Sig  . aspirin 81 MG tablet Take 81 mg by mouth daily.  Marland Kitchen ibuprofen (ADVIL,MOTRIN) 600 MG tablet Take 1 tablet (600 mg total) by mouth every 6 (six) hours as needed.  Marland Kitchen lisinopril (PRINIVIL,ZESTRIL) 5 MG tablet Take 2.5 mg by mouth every morning.   . Multiple Vitamin (MULTIVITAMIN) capsule Take 1 capsule by mouth daily.  . nitroGLYCERIN (NITROSTAT) 0.4 MG SL tablet Place 1 tablet (0.4 mg total) under the tongue every 5 (five) minutes as needed for chest pain.     Allergies:   Crestor [rosuvastatin]; Erythromycin; and Lipitor [atorvastatin]   Social History   Socioeconomic History  . Marital status: Divorced    Spouse name: Not on file  . Number of children: Not on file  . Years of education: Not on file  . Highest education level: Not on file  Occupational History  . Not on file  Social Needs  . Financial resource strain: Not on file  . Food insecurity:    Worry: Not on file    Inability: Not on file  . Transportation needs:    Medical: Not on file    Non-medical: Not on file  Tobacco Use  . Smoking status: Never Smoker  . Smokeless tobacco: Never Used  Substance and Sexual Activity  . Alcohol use: No  . Drug use: No  . Sexual activity:  Yes    Birth control/protection: Other-see comments    Comment: PARTNER HAD VAS.  Lifestyle  . Physical activity:    Days per week: Not on file    Minutes per session: Not on file  . Stress: Not on file  Relationships  . Social connections:    Talks on phone: Not on file    Gets together: Not on file    Attends religious service: Not on file    Active member of club or organization: Not on file    Attends meetings of clubs or organizations: Not on file    Relationship status: Not on file  Other Topics Concern  . Not on file  Social History Narrative  . Not on file     Family History: The patient's family history includes Heart disease in her  father; Hypertension in her brother and sister; Ovarian cancer (age of onset: 55) in her mother.  ROS:   Please see the history of present illness.    Hematuria led to the diagnosis of kidney stones.  Evaluation of kidney stones identify large fibroids.  Fibroids were removed surgically.  Unable to tolerate statin therapy.  May also be unable to tolerate Repatha although additional information is needed concerning why primary care discontinued Repatha.  All other systems reviewed and are negative.  EKGs/Labs/Other Studies Reviewed:    The following studies were reviewed today: None  EKG:  EKG is  ordered today.  The ekg ordered today demonstrates normal appearance.  Sinus rhythm.  Recent Labs: 05/12/2017: BUN 14; Creatinine, Ser 0.90; Platelets 195 06/03/2017: ALT 19 09/26/2017: Hemoglobin 13.3; Potassium 4.5; Sodium 143  Recent Lipid Panel    Component Value Date/Time   CHOL 119 06/03/2017 0812   TRIG 47 06/03/2017 0812   HDL 56 06/03/2017 0812   CHOLHDL 2.1 06/03/2017 0812   CHOLHDL 2.1 10/31/2015 0800   VLDL 13 10/31/2015 0800   LDLCALC 54 06/03/2017 0812    Physical Exam:    VS:  BP 126/74   Pulse 68   Ht 5\' 5"  (1.651 m)   Wt 173 lb (78.5 kg)   LMP 09/21/2013   SpO2 99%   BMI 28.79 kg/m     Wt Readings from Last 3 Encounters:  11/15/17 173 lb (78.5 kg)  09/26/17 173 lb (78.5 kg)  05/12/17 173 lb (78.5 kg)     GEN:  Well nourished, well developed in no acute distress HEENT: Normal NECK: No JVD. LYMPHATICS: No lymphadenopathy CARDIAC: RRR, no murmur, no gallop, no edema. VASCULAR: 2+ radial pulses.  No bruits. RESPIRATORY:  Clear to auscultation without rales, wheezing or rhonchi  ABDOMEN: Soft, non-tender, non-distended, No pulsatile mass, MUSCULOSKELETAL: No deformity  SKIN: Warm and dry NEUROLOGIC:  Alert and oriented x 3 PSYCHIATRIC:  Normal affect   ASSESSMENT:    1. Coronary artery disease involving native coronary artery of native heart with angina  pectoris ([place])   2. Essential hypertension, benign   3. Hyperlipidemia, unspecified hyperlipidemia type    PLAN:    In order of problems listed above:  1. Asymptomatic coronary disease.  Discussed the importance of aggressive secondary risk factor modification: A1c less than 7, LDL less than 70 (not currently on antilipid therapy), blood pressure 130/80 mmHg or less, aerobic activity, and weight control/loss. 2. Low-salt diet, exercise, weight loss, and compliance with anti-hypertensive therapy 3. Check lipid panel today.  Clinical follow-up in 1 year.   Medication Adjustments/Labs and Tests Ordered: Current medicines are reviewed at length  with the patient today.  Concerns regarding medicines are outlined above.  Orders Placed This Encounter  Procedures  . Hepatic function panel  . Lipid Profile  . CK (Creatine Kinase)   No orders of the defined types were placed in this encounter.   Patient Instructions  Medication Instructions:  Your physician recommends that you continue on your current medications as directed. Please refer to the Current Medication list given to you today.  Labwork: Liver, Lipid and CPK today  Testing/Procedures: None  Follow-Up: Your physician wants you to follow-up in: 1 year with Dr. Tamala Julian.  You will receive a reminder letter in the mail two months in advance. If you don't receive a letter, please call our office to schedule the follow-up appointment.   Any Other Special Instructions Will Be Listed Below (If Applicable).     If you need a refill on your cardiac medications before your next appointment, please call your pharmacy.      Signed, Sinclair Grooms, MD  11/15/2017 9:11 AM    [place]

## 2017-11-15 ENCOUNTER — Encounter: Payer: Self-pay | Admitting: Interventional Cardiology

## 2017-11-15 ENCOUNTER — Ambulatory Visit: Payer: Managed Care, Other (non HMO) | Admitting: Interventional Cardiology

## 2017-11-15 VITALS — BP 126/74 | HR 68 | Ht 65.0 in | Wt 173.0 lb

## 2017-11-15 DIAGNOSIS — I25119 Atherosclerotic heart disease of native coronary artery with unspecified angina pectoris: Secondary | ICD-10-CM | POA: Diagnosis not present

## 2017-11-15 DIAGNOSIS — E785 Hyperlipidemia, unspecified: Secondary | ICD-10-CM | POA: Diagnosis not present

## 2017-11-15 DIAGNOSIS — I10 Essential (primary) hypertension: Secondary | ICD-10-CM

## 2017-11-15 LAB — LIPID PANEL
CHOL/HDL RATIO: 2.9 ratio (ref 0.0–4.4)
Cholesterol, Total: 211 mg/dL — ABNORMAL HIGH (ref 100–199)
HDL: 73 mg/dL (ref >39)
LDL Calculated: 126 mg/dL — ABNORMAL HIGH (ref 0–99)
Triglycerides: 61 mg/dL (ref 0–149)
VLDL CHOLESTEROL CAL: 12 mg/dL (ref 5–40)

## 2017-11-15 LAB — HEPATIC FUNCTION PANEL
ALBUMIN: 4.7 g/dL (ref 3.5–5.5)
ALT: 30 IU/L (ref 0–32)
AST: 55 IU/L — AB (ref 0–40)
Alkaline Phosphatase: 78 IU/L (ref 39–117)
BILIRUBIN TOTAL: 0.7 mg/dL (ref 0.0–1.2)
Bilirubin, Direct: 0.19 mg/dL (ref 0.00–0.40)
Total Protein: 7.3 g/dL (ref 6.0–8.5)

## 2017-11-15 LAB — CK: CK TOTAL: 140 U/L (ref 24–173)

## 2017-11-15 NOTE — Patient Instructions (Signed)
Medication Instructions:  Your physician recommends that you continue on your current medications as directed. Please refer to the Current Medication list given to you today.  Labwork: Liver, Lipid and CPK today  Testing/Procedures: None  Follow-Up: Your physician wants you to follow-up in: 1 year with Dr. Tamala Julian.  You will receive a reminder letter in the mail two months in advance. If you don't receive a letter, please call our office to schedule the follow-up appointment.   Any Other Special Instructions Will Be Listed Below (If Applicable).     If you need a refill on your cardiac medications before your next appointment, please call your pharmacy.

## 2017-11-18 ENCOUNTER — Telehealth: Payer: Self-pay

## 2017-11-18 NOTE — Telephone Encounter (Signed)
Pt will stop by this afternoon to pick up 2 Praluent samples.

## 2017-11-18 NOTE — Telephone Encounter (Signed)
Contacted [name] to discuss her most recent LDLc that is not at goal of 70mg /dL. In the past she has tried several statins and Zetia discontinuing due to muscle aches and joint pain. She was started on Repatha 9/18 and did not tolerate the medication reporting low back pain and tingling. At this time her lipid were at goal and she was not restarted on a lipid lowering medication.   Patient is agreeable to try Praluent 75mg  SQ q14 days. She will pick up drug samples to determine tolerability then will start the PA process. Of note, patient does have new insurance Scientist, clinical (histocompatibility and immunogenetics)) than when Repatha was approved Nurse, mental health).   Isaias Sakai, Sherian Rein D PGY1 Pharmacy Resident  Phone [phone] 11/18/2017      11:58 AM

## 2017-11-29 NOTE — Telephone Encounter (Signed)
Pt called clinic to report myalgias on Praluent samples. She states she could barely walk yesterday. She previously experienced myalgias on Repatha, as well as atorvastatin, rosuvastatin 5mg  daily, and Zetia 10mg  daily (myalgias with all). Pt has history of CAD s/p DES x2 in 2012. Pt is interested in clinical trials.  Will route to research to screen patient for ORION study - will need washout from [employer] prior to enrollment.

## 2017-12-16 ENCOUNTER — Telehealth: Payer: Self-pay | Admitting: Pharmacist

## 2017-12-16 DIAGNOSIS — E782 Mixed hyperlipidemia: Secondary | ICD-10-CM

## 2017-12-16 MED ORDER — PRAVASTATIN SODIUM 20 MG PO TABS: 10.0000 mg | ORAL_TABLET | Freq: Every evening | ORAL | 11 refills | Status: DC

## 2017-12-16 NOTE — Telephone Encounter (Signed)
Pt called clinic wondering if there is anything else she can take for her cholesterol while she waits to hear about research trials for the fall. She is intolerant to atorvastatin, rosuvastatin 5mg , Zetia, Praluent, and Repatha (myalgias with all). She is willing to try low dose of pravastatin 10mg  every evening and will start CoQ10 200mg  daily as well to see if this helps with tolerability. Will recheck lipids and LFTs in 2 months.

## 2018-01-09 ENCOUNTER — Telehealth: Payer: Self-pay | Admitting: Pharmacist

## 2018-01-09 MED ORDER — PITAVASTATIN CALCIUM 1 MG PO TABS: 1.0000 mg | ORAL_TABLET | ORAL | Status: DC

## 2018-01-09 NOTE — Telephone Encounter (Signed)
Pt called to report that she tried taking pravastatin and even tried taking it every other day and still has been unable to tolerate this. She states that she saw her sister this weekend who reports that she is tolerating Livalo 1mg  every other day well. She wonders if she can try this. I advised that we can try this. We have samples of the 2mg  and she can cut these in half and take every other day. I have left 14 tablets at the front for her. She will pick these up tomorrow and start. She is aware to call with any issues. Will check on her in 3-4 weeks to see how tolerating.

## 2018-02-13 ENCOUNTER — Other Ambulatory Visit: Payer: 59 | Admitting: *Deleted

## 2018-02-13 DIAGNOSIS — E782 Mixed hyperlipidemia: Secondary | ICD-10-CM

## 2018-02-13 LAB — HEPATIC FUNCTION PANEL
ALT: 18 IU/L (ref 0–32)
AST: 40 IU/L (ref 0–40)
Albumin: 4.5 g/dL (ref 3.5–5.5)
Alkaline Phosphatase: 75 IU/L (ref 39–117)
Bilirubin Total: 0.4 mg/dL (ref 0.0–1.2)
Bilirubin, Direct: 0.11 mg/dL (ref 0.00–0.40)
TOTAL PROTEIN: 6.9 g/dL (ref 6.0–8.5)

## 2018-02-13 LAB — LIPID PANEL
CHOL/HDL RATIO: 2.4 ratio (ref 0.0–4.4)
CHOLESTEROL TOTAL: 161 mg/dL (ref 100–199)
HDL: 66 mg/dL (ref >39)
LDL CALC: 86 mg/dL (ref 0–99)
Triglycerides: 45 mg/dL (ref 0–149)
VLDL Cholesterol Cal: 9 mg/dL (ref 5–40)

## 2018-02-14 ENCOUNTER — Other Ambulatory Visit: Payer: Self-pay | Admitting: Pharmacist

## 2018-02-14 ENCOUNTER — Telehealth: Payer: Self-pay | Admitting: Pharmacist

## 2018-02-14 MED ORDER — PITAVASTATIN CALCIUM 2 MG PO TABS: ORAL_TABLET | ORAL | 11 refills | Status: DC

## 2018-02-14 NOTE — Telephone Encounter (Signed)
Called pt - she reports she has been taking Livalo 0.5mg  (1/4 tablet of the 2mg  tablet) every other day. Excellent lipid response to 0.5mg  Livalo every other day with LDL decrease from 126 to 86. She has been tolerating therapy well and is willing to increase her dose to 1/2 tablet every other day to target LDL < 70. She has made large improvements in her diet as well. She will call with tolerability updates so that we can schedule follow up lab work. Rx sent to pharmacy and pt provided with copay card via text as well.

## 2018-02-21 ENCOUNTER — Other Ambulatory Visit: Payer: Self-pay | Admitting: Pharmacist

## 2018-02-21 DIAGNOSIS — E782 Mixed hyperlipidemia: Secondary | ICD-10-CM

## 2018-02-24 ENCOUNTER — Other Ambulatory Visit: Payer: 59 | Admitting: *Deleted

## 2018-02-24 DIAGNOSIS — E782 Mixed hyperlipidemia: Secondary | ICD-10-CM

## 2018-02-24 LAB — CK: Total CK: 303 U/L — ABNORMAL HIGH (ref 24–173)

## 2018-02-25 ENCOUNTER — Telehealth: Payer: Self-pay | Admitting: Pharmacist

## 2018-02-25 DIAGNOSIS — E782 Mixed hyperlipidemia: Secondary | ICD-10-CM

## 2018-02-25 NOTE — Telephone Encounter (Signed)
Tough case. She will likely be my first Bempedoic acid cath.

## 2018-02-25 NOTE — Telephone Encounter (Signed)
Pt is intolerant to atorvastatin, rosuvastatin 5mg , Zetia, pravastatin 10mg  every other day, Praluent, and Repatha (myalgias with all). Most recently, she was challenged with Livalo 0.5mg  every other day which improved LDL from 126 to 86. However, pt developed mild muscle aches and wished to have her CK checked. CK increased from 140 to 303. Discussed stopping Livalo however she wishes to try to continue Livalo in some capacity. Advised her to reduce dose to 1/2 tablet once a week and will recheck CK in 3 weeks to ensure it's trending down. She does not qualify for any trials we are currently enrolling in. Discussed that bempedoic acid is expected to be FDA approved in February 2020 and should lower LDL ~20%. Will reach out to pt once this is available as she is willing to try this.

## 2018-03-13 ENCOUNTER — Telehealth: Payer: Self-pay | Admitting: Interventional Cardiology

## 2018-03-13 NOTE — Telephone Encounter (Signed)
Patient c/o Palpitations:  High priority if patient c/o lightheadedness, shortness of breath, or chest pain  1) How long have you had palpitations/irregular HR/ Afib? Are you having the symptoms now?  About 2 weeks- more frequent this week  2) Are you currently experiencing lightheadedness, SOB or CP? No  3) Do you have a history of afib (atrial fibrillation) or irregular heart rhythm? No afib- palpitations off and on  4) Have you checked your BP or HR? (document readings if available): have not checked her blood pressure-will check it when she get to work  5) Are you experiencing any other symptoms? No- pt would like to be seen by somebody today if possible, she is going out of town tomorrowm

## 2018-03-13 NOTE — Telephone Encounter (Signed)
Pt states that over the last couple of weeks she has noticed an increase in the amount of palpitations she is having.  She does admit to increased stress.  Denies any diet changes that may cause increased palps.  Denies CP, SOB, lightheadedness, dizziness or other symptoms.  BP this morning was 158/87 prior to medications.  She is going out of town this weekend and would like to be seen if possible.  Scheduled pt to see Richardson Dopp, PA-C tomorrow at 11:15A.  Pt aware if any changes or further symptoms develop, to go to ER for eval.  Pt verbalized understanding and was in agreement with this plan.

## 2018-03-14 ENCOUNTER — Encounter: Payer: Self-pay | Admitting: Physician Assistant

## 2018-03-14 ENCOUNTER — Ambulatory Visit (INDEPENDENT_AMBULATORY_CARE_PROVIDER_SITE_OTHER): Payer: 59 | Admitting: Physician Assistant

## 2018-03-14 ENCOUNTER — Other Ambulatory Visit: Payer: 59 | Admitting: *Deleted

## 2018-03-14 VITALS — BP 130/78 | HR 69 | Ht 65.0 in | Wt 173.0 lb

## 2018-03-14 DIAGNOSIS — I10 Essential (primary) hypertension: Secondary | ICD-10-CM | POA: Diagnosis not present

## 2018-03-14 DIAGNOSIS — E782 Mixed hyperlipidemia: Secondary | ICD-10-CM

## 2018-03-14 DIAGNOSIS — I2510 Atherosclerotic heart disease of native coronary artery without angina pectoris: Secondary | ICD-10-CM

## 2018-03-14 DIAGNOSIS — R002 Palpitations: Secondary | ICD-10-CM | POA: Diagnosis not present

## 2018-03-14 DIAGNOSIS — E785 Hyperlipidemia, unspecified: Secondary | ICD-10-CM | POA: Diagnosis not present

## 2018-03-14 NOTE — Progress Notes (Signed)
Cardiology Office Note:    Date:  03/14/2018   ID:  [name], DOB June 14, 1965, MRN [id number]  PCP:  Amy Low, MD  Cardiologist:  Amy Grooms, MD  Electrophysiologist:  None   Referring MD: Amy Low, MD   Chief Complaint  Patient presents with  . Palpitations    History of Present Illness:    [name] is a 52 y.o. female with coronary artery disease status post drug-eluting stent x2 to the LAD in June 2012, hypertension, hyperlipidemia.  She is intolerant of statins as well as Repatha and Praluent.  She has had elevated CPK levels in the past.  She was recently on Livalo and is followed by our lipid clinic.  This was also stopped due to increasing CPK levels.  She is to be considered for Bempedoic Acid once it is FDA approved in February 2020.  She was last seen by Dr. Tamala Small in August 2019.  She recently called in with complaints of palpitations.  [name] returns for the evaluation of palpitations.  She is a Copy.  She has been under a considerable amount of stress recently.  She is also concerned about her CK level which continues to go up with any cholesterol medication.  Over the last several days, she has noted palpitations.  Her heartbeat feels irregular.  She denies any racing heartbeats. She has also noted her blood pressure is elevated.  She avoids caffeine and other over-the-counter stimulants.  She denies chest discomfort, shortness of breath, syncope, paroxysmal nocturnal dyspnea or lower extremity swelling.  Prior CV studies:   The following studies were reviewed today:  Cardiac catheterization 09/26/2010 EF 70 LM widely patent LAD mid 70 than 50, distal 80-90 LCx normal RCA normal PCI: 3 x 12 mm Resolute DES to distal LAD and 3.5 x 18 mm Resolute DES to the mid LAD  Past Medical History:  Diagnosis Date  . Coronary artery disease cardiologist-  dr [name]   09-26-2010  cardiac cath w/ PCI and DES x2 to LAD  . Family history  of early CAD   . Fatty liver   . PMB (postmenopausal bleeding)   . S/P drug eluting coronary stent placement 09/26/2010   x2 to LAD  . Uterine leiomyoma   . Wears glasses    Surgical Hx: The patient  has a past surgical history that includes Breast biopsy (Right, 12/30/2012); Coronary angioplasty with stent (09-26-2010   dr [name]); Colonoscopy with propofol (2016); and Dilatation & curettage/hysteroscopy with myosure (N/A, 09/26/2017).   Current Medications: Current Meds  Medication Sig  . aspirin 81 MG tablet Take 81 mg by mouth daily.  Marland Kitchen lisinopril (PRINIVIL,ZESTRIL) 5 MG tablet Take 2.5 mg by mouth every morning.   . Multiple Vitamin (MULTIVITAMIN) capsule Take 1 capsule by mouth daily.  . nitroGLYCERIN (NITROSTAT) 0.4 MG SL tablet Place 1 tablet (0.4 mg total) under the tongue every 5 (five) minutes as needed for chest pain.  . [DISCONTINUED] ibuprofen (ADVIL,MOTRIN) 600 MG tablet Take 1 tablet (600 mg total) by mouth every 6 (six) hours as needed.     Allergies:   Erythromycin; Crestor [rosuvastatin]; Lipitor [atorvastatin]; and Livalo [pitavastatin]   Social History   Tobacco Use  . Smoking status: Never Smoker  . Smokeless tobacco: Never Used  Substance Use Topics  . Alcohol use: No  . Drug use: No     Family Hx: The patient's family history includes Heart disease in her father; Hypertension  in her brother and sister; Ovarian cancer (age of onset: 14) in her mother.  ROS:   Please see the history of present illness.    Review of Systems  Cardiovascular: Positive for irregular heartbeat.  Musculoskeletal: Positive for myalgias.   All other systems reviewed and are negative.   EKGs/Labs/Other Test Reviewed:    EKG:  EKG is  ordered today.  The ekg ordered today demonstrates normal sinus rhythm, heart rate 69, normal axis, QTC 417  Recent Labs: 05/12/2017: BUN 14; Creatinine, Ser 0.90; Platelets 195 09/26/2017: Hemoglobin 13.3; Potassium 4.5; Sodium  143 02/13/2018: ALT 18   Recent Lipid Panel Lab Results  Component Value Date/Time   CHOL 161 02/13/2018 07:47 AM   TRIG 45 02/13/2018 07:47 AM   HDL 66 02/13/2018 07:47 AM   CHOLHDL 2.4 02/13/2018 07:47 AM   CHOLHDL 2.1 10/31/2015 08:00 AM   LDLCALC 86 02/13/2018 07:47 AM    Physical Exam:    VS:  BP 130/78   Pulse 69   Ht 5\' 5"  (1.651 m)   Wt 173 lb (78.5 kg)   LMP 09/21/2013   SpO2 99%   BMI 28.79 kg/m     Wt Readings from Last 3 Encounters:  03/14/18 173 lb (78.5 kg)  11/15/17 173 lb (78.5 kg)  09/26/17 173 lb (78.5 kg)     Physical Exam  Constitutional: She is oriented to person, place, and time. She appears well-developed and well-nourished. No distress.  HENT:  Head: Normocephalic and atraumatic.  Eyes: No scleral icterus.  Neck: No JVD present. No thyromegaly present.  Cardiovascular: Normal rate and regular rhythm.  No murmur heard. Pulmonary/Chest: Effort normal. She has no rales.  Abdominal: Soft.  Musculoskeletal:        General: No edema.  Lymphadenopathy:    She has no cervical adenopathy.  Neurological: She is alert and oriented to person, place, and time.  Skin: Skin is warm and dry.  Psychiatric: She has a normal mood and affect.    ASSESSMENT & PLAN:    Palpitations She is likely describing PACs or PVCs.  Continue to limit caffeine.  I did consider whether or not to start Small-dose beta-blocker therapy.  However, I would prefer that she undergo a Holter monitor first.  If she has a significant amount of PVCs or PACs, we can consider Small-dose metoprolol succinate.  -Obtain BMET, magnesium, TSH   -Obtain 48-hour Holter  -Consider beta-blocker therapy  Coronary artery disease involving native coronary artery of native heart without angina pectoris History of drug-eluting stent x2 to the LAD in 2012.  She denies anginal symptoms.  Continue aspirin.  Essential hypertension, benign She has noted elevations in her blood pressure recently (as  high as 158/87).  I have asked her to track her blood pressures.  If she has a significant amount of PACs or PVCs on Holter monitor, I will consider placing her on metoprolol succinate 25 mg daily.  Hyperlipidemia, unspecified hyperlipidemia type She is intolerant to all statins as well as PCSK9 inhibitors.  She has had elevations in her CK level.  Repeat CK is pending today.  She will be considered for bempedoic acid once it becomes available next year.  She is followed by our lipid clinic.   Dispo:  Return in about 4 weeks (around 04/11/2018) for Follow up after testing w/ Dr. Tamala Small , or Richardson Dopp, PA-C.   Medication Adjustments/Labs and Tests Ordered: Current medicines are reviewed at length with the patient today.  Concerns  regarding medicines are outlined above.  Tests Ordered: Orders Placed This Encounter  Procedures  . Basic metabolic panel  . TSH  . Magnesium  . HOLTER MONITOR - 48 HOUR  . EKG 12-Lead   Medication Changes: No orders of the defined types were placed in this encounter.   Signed, Richardson Dopp, PA-C  03/14/2018 12:24 PM    [facility] [address], [place], [place]  [address] Phone: [phone]; Fax: [phone]

## 2018-03-14 NOTE — Patient Instructions (Signed)
Medication Instructions:  Your physician recommends that you continue on your current medications as directed. Please refer to the Current Medication list given to you today.  If you need a refill on your cardiac medications before your next appointment, please call your pharmacy.   Lab work: TODAY: BMET, MAG, TSH If you have labs (blood work) drawn today and your tests are completely normal, you will receive your results only by: Marland Kitchen MyChart Message (if you have MyChart) OR . A paper copy in the mail If you have any lab test that is abnormal or we need to change your treatment, we will call you to review the results.  Testing/Procedures: Your physician has recommended that you wear a 48-HOUR holter monitor. Holter monitors are medical devices that record the heart's electrical activity. Doctors most often use these monitors to diagnose arrhythmias. Arrhythmias are problems with the speed or rhythm of the heartbeat. The monitor is a small, portable device. You can wear one while you do your normal daily activities. This is usually used to diagnose what is causing palpitations/syncope (passing out).  Follow-Up: At [facility], you and your health needs are our priority.  As part of our continuing mission to provide you with exceptional heart care, we have created designated Provider Care Teams.  These Care Teams include your primary Cardiologist (physician) and Advanced Practice Providers (APPs -  Physician Assistants and Nurse Practitioners) who all work together to provide you with the care you need, when you need it. You will need a follow up appointment in 1 months.  Please call our office 2 months in advance to schedule this appointment.  You may see Sinclair Grooms, MD or [facility], PA-C   Any Other Special Instructions Will Be Listed Below (If Applicable).

## 2018-03-15 LAB — BASIC METABOLIC PANEL
BUN/Creatinine Ratio: 14 (ref 9–23)
BUN: 12 mg/dL (ref 6–24)
CO2: 22 mmol/L (ref 20–29)
Calcium: 9.8 mg/dL (ref 8.7–10.2)
Chloride: 103 mmol/L (ref 96–106)
Creatinine, Ser: 0.84 mg/dL (ref 0.57–1.00)
GFR calc Af Amer: 92 mL/min/{1.73_m2} (ref >59)
GFR calc non Af Amer: 80 mL/min/{1.73_m2} (ref >59)
GLUCOSE: 84 mg/dL (ref 65–99)
POTASSIUM: 4.6 mmol/L (ref 3.5–5.2)
SODIUM: 142 mmol/L (ref 134–144)

## 2018-03-15 LAB — MAGNESIUM: MAGNESIUM: 2.1 mg/dL (ref 1.6–2.3)

## 2018-03-15 LAB — CK: CK TOTAL: 116 U/L (ref 24–173)

## 2018-03-15 LAB — TSH: TSH: 1.59 u[IU]/mL (ref 0.450–4.500)

## 2018-03-17 ENCOUNTER — Other Ambulatory Visit: Payer: 59

## 2018-03-17 ENCOUNTER — Telehealth: Payer: Self-pay | Admitting: Pharmacist

## 2018-03-17 NOTE — Telephone Encounter (Signed)
CK normalized on Livalo 1/2 tab once weekly. Will increase to 1/2 tab twice weekly. Pt will continue to monitor symptoms.

## 2018-04-15 ENCOUNTER — Ambulatory Visit: Payer: 59 | Admitting: Physician Assistant

## 2018-07-18 ENCOUNTER — Telehealth: Payer: Self-pay | Admitting: Pharmacist

## 2018-07-18 NOTE — Telephone Encounter (Signed)
Called pt to follow up with lipids. She has stopped her Livalo due to myalgias but is interested in trying Nexletol. She started a new job and is waiting for her new insurance card to come in the mail. She will give [place] a call in the next week or two when this arrives so that we can start the prior authorization process for Nexletol.

## 2018-08-13 ENCOUNTER — Telehealth: Payer: Self-pay | Admitting: Pharmacist

## 2018-08-13 NOTE — Telephone Encounter (Signed)
PA for [facility] submitted under new [employer] Member ID [id number] Group [id number] OCN [id number] BIN [id number]  PA was denied. Will appeal decision. Patient made aware that we will send appeals letter and will be in contact when a decision is made.

## 2018-09-23 ENCOUNTER — Telehealth: Payer: Self-pay | Admitting: Pharmacist

## 2018-09-23 NOTE — Telephone Encounter (Signed)
[employer] denied appeal for Nexletol due to the fact that it is non-formulary (has not been reviewed yet). I informed patient of the denial and that we were submitting a second level review request via mail today. Will keep patient updated

## 2018-12-18 ENCOUNTER — Encounter: Payer: Self-pay | Admitting: Pharmacist

## 2018-12-18 MED ORDER — NEXLETOL 180 MG PO TABS: 1.0000 | ORAL_TABLET | Freq: Every day | ORAL | 3 refills | Status: DC

## 2018-12-18 NOTE — Telephone Encounter (Signed)
Nexletol rx sent to pharmacy, $10/3 month copay card activated.

## 2018-12-18 NOTE — Telephone Encounter (Signed)
This encounter was created in error - please disregard.

## 2019-03-01 NOTE — Progress Notes (Signed)
Cardiology Office Note:    Date:  03/02/2019   ID:  [name], DOB 04/14/1965, MRN [id number]  PCP:  Wenda Low, MD  Cardiologist:  Sinclair Grooms, MD   Referring MD: Wenda Low, MD   Chief Complaint  Patient presents with  . Coronary Artery Disease  . Cardiac Valve Problem    History of Present Illness:    [name] is a 53 y.o. female with a hx of  CAD, LAD DES 2in 2012, hypertension, and hyperlipidemia.  [name] is intolerant of statins.  Her current insurance plan so shutdown attempts to start nonstatin therapy.  She denies angina.  She is physically active.  Not as active as previous.  She is on no specific therapy currently.  She has prediabetes.  She does not smoke.  She is currently doing Pilates and attempt to control weight and maintain physical fitness.  Past Medical History:  Diagnosis Date  . Coronary artery disease cardiologist-  dr Daneen Schick   09-26-2010  cardiac cath w/ PCI and DES x2 to LAD  . Family history of early CAD   . Fatty liver   . PMB (postmenopausal bleeding)   . S/P drug eluting coronary stent placement 09/26/2010   x2 to LAD  . Uterine leiomyoma   . Wears glasses     Past Surgical History:  Procedure Laterality Date  . BREAST BIOPSY Right 12/30/2012   Procedure: CYST EXCISION BREAST;  Surgeon: Marcello Moores A. Cornett, MD;  Location: [facility];  Service: General;  Laterality: Right;  . COLONOSCOPY WITH PROPOFOL  2016  . CORONARY ANGIOPLASTY WITH STENT PLACEMENT  09-26-2010   dr Daneen Schick   unstable angina w/ exertion & abnormal ETT-- PCI and DES to midLAD and distalLAD (x2 stents),  normal LVF, ef 70%  . DILATATION & CURETTAGE/HYSTEROSCOPY WITH MYOSURE N/A 09/26/2017   Procedure: DILATATION & CURETTAGE/HYSTEROSCOPY WITH MYOSURE;  Surgeon: Waymon Amato, MD;  Location: [place];  Service: Gynecology;  Laterality: N/A;    Current Medications: Current Meds  Medication Sig  . aspirin 81 MG  tablet Take 81 mg by mouth daily.  Marland Kitchen lisinopril (PRINIVIL,ZESTRIL) 5 MG tablet Take 2.5 mg by mouth every morning.   . Multiple Vitamin (MULTIVITAMIN) capsule Take 1 capsule by mouth daily.  . nitroGLYCERIN (NITROSTAT) 0.4 MG SL tablet Place 1 tablet (0.4 mg total) under the tongue every 5 (five) minutes as needed for chest pain.     Allergies:   Erythromycin, Crestor [rosuvastatin], Lipitor [atorvastatin], and Livalo [pitavastatin]   Social History   Socioeconomic History  . Marital status: Divorced    Spouse name: Not on file  . Number of children: Not on file  . Years of education: Not on file  . Highest education level: Not on file  Occupational History  . Not on file  Social Needs  . Financial resource strain: Not on file  . Food insecurity    Worry: Not on file    Inability: Not on file  . Transportation needs    Medical: Not on file    Non-medical: Not on file  Tobacco Use  . Smoking status: Never Smoker  . Smokeless tobacco: Never Used  Substance and Sexual Activity  . Alcohol use: No  . Drug use: No  . Sexual activity: Yes    Birth control/protection: Other-see comments    Comment: PARTNER HAD VAS.  Lifestyle  . Physical activity    Days per week: Not on  file    Minutes per session: Not on file  . Stress: Not on file  Relationships  . Social Herbalist on phone: Not on file    Gets together: Not on file    Attends religious service: Not on file    Active member of club or organization: Not on file    Attends meetings of clubs or organizations: Not on file    Relationship status: Not on file  Other Topics Concern  . Not on file  Social History Narrative  . Not on file     Family History: The patient's family history includes Heart disease in her father; Hypertension in her brother and sister; Ovarian cancer (age of onset: 49) in her mother.  ROS:   Please see the history of present illness.    Anxiety but otherwise unremarkable.  All other  systems reviewed and are negative.  EKGs/Labs/Other Studies Reviewed:    The following studies were reviewed today: No recent functional or imaging data  EKG:  EKG normal sinus rhythm, poor R wave progression, overall normal in appearance.  No change when compared to prior.  Recent Labs: 03/14/2018: BUN 12; Creatinine, Ser 0.84; Magnesium 2.1; Potassium 4.6; Sodium 142; TSH 1.590  Recent Lipid Panel    Component Value Date/Time   CHOL 161 02/13/2018 0747   TRIG 45 02/13/2018 0747   HDL 66 02/13/2018 0747   CHOLHDL 2.4 02/13/2018 0747   CHOLHDL 2.1 10/31/2015 0800   VLDL 13 10/31/2015 0800   LDLCALC 86 02/13/2018 0747    Physical Exam:    VS:  BP 118/76   Pulse 66   Ht 5\' 5"  (1.651 m)   Wt 176 lb 1.9 oz (79.9 kg)   LMP 09/21/2013   SpO2 99%   BMI 29.31 kg/m     Wt Readings from Last 3 Encounters:  03/02/19 176 lb 1.9 oz (79.9 kg)  03/14/18 173 lb (78.5 kg)  11/15/17 173 lb (78.5 kg)     GEN: Mildly overweight appearing younger than stated age.. No acute distress HEENT: Normal NECK: No JVD. LYMPHATICS: No lymphadenopathy CARDIAC: 2/6 crescendo decrescendo systolic right upper sternal border murmur.  RRR without diastolic murmur, gallop, or edema. VASCULAR:  Normal Pulses. No bruits. RESPIRATORY:  Clear to auscultation without rales, wheezing or rhonchi  ABDOMEN: Soft, non-tender, non-distended, No pulsatile mass, MUSCULOSKELETAL: No deformity  SKIN: Warm and dry NEUROLOGIC:  Alert and oriented x 3 PSYCHIATRIC:  Normal affect   ASSESSMENT:    1. Coronary artery disease involving native coronary artery of native heart without angina pectoris   2. Hyperlipidemia, unspecified hyperlipidemia type   3. Essential hypertension, benign   4. Educated about COVID-19 virus infection   5. Systolic murmur    PLAN:    In order of problems listed above:  1. Secondary prevention discussed.  No obvious risk factor that is not being controlled as is needed is lipids.  We  will check a fasting lipid panel to gauge where we are. 2. Lipid panel will be done fasting 3. Blood pressure control is excellent 4.  3W's is understood and being practiced.  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 5. 2D Doppler echocardiogram to assess aortic valve and to rule out LV outflow obstruction.  Overall education and awareness concerning secondary risk prevention was discussed in detail: LDL less than 70, hemoglobin A1c less than 7, blood pressure target less than 130/80 mmHg, >150 minutes of moderate aerobic activity per week, avoidance  of smoking, weight control (via diet and exercise), and continued surveillance/management of/for obstructive sleep apnea.    Medication Adjustments/Labs and Tests Ordered: Current medicines are reviewed at length with the patient today.  Concerns regarding medicines are outlined above.  No orders of the defined types were placed in this encounter.  No orders of the defined types were placed in this encounter.   There are no Patient Instructions on file for this visit.   Signed, Sinclair Grooms, MD  03/02/2019 11:13 AM    [facility]

## 2019-03-02 ENCOUNTER — Encounter: Payer: Self-pay | Admitting: Interventional Cardiology

## 2019-03-02 ENCOUNTER — Other Ambulatory Visit: Payer: Self-pay

## 2019-03-02 ENCOUNTER — Ambulatory Visit: Payer: 59 | Admitting: Interventional Cardiology

## 2019-03-02 VITALS — BP 118/76 | HR 66 | Ht 65.0 in | Wt 176.1 lb

## 2019-03-02 DIAGNOSIS — Z7189 Other specified counseling: Secondary | ICD-10-CM | POA: Diagnosis not present

## 2019-03-02 DIAGNOSIS — I10 Essential (primary) hypertension: Secondary | ICD-10-CM | POA: Diagnosis not present

## 2019-03-02 DIAGNOSIS — I2510 Atherosclerotic heart disease of native coronary artery without angina pectoris: Secondary | ICD-10-CM | POA: Diagnosis not present

## 2019-03-02 DIAGNOSIS — E785 Hyperlipidemia, unspecified: Secondary | ICD-10-CM | POA: Diagnosis not present

## 2019-03-02 DIAGNOSIS — R011 Cardiac murmur, unspecified: Secondary | ICD-10-CM

## 2019-03-02 NOTE — Patient Instructions (Signed)
Medication Instructions:  Your physician recommends that you continue on your current medications as directed. Please refer to the Current Medication list given to you today.  *If you need a refill on your cardiac medications before your next appointment, please call your pharmacy*  Lab Work: Lipid, Liver, BMET and A1C when you are fasting (nothing to eat or drink after midnight except water and black coffee).   If you have labs (blood work) drawn today and your tests are completely normal, you will receive your results only by: Marland Kitchen MyChart Message (if you have MyChart) OR . A paper copy in the mail If you have any lab test that is abnormal or we need to change your treatment, we will call you to review the results.  Testing/Procedures: Your physician has requested that you have an echocardiogram. Echocardiography is a painless test that uses sound waves to create images of your heart. It provides your doctor with information about the size and shape of your heart and how well your heart's chambers and valves are working. This procedure takes approximately one hour. There are no restrictions for this procedure.   Follow-Up: At [facility], you and your health needs are our priority.  As part of our continuing mission to provide you with exceptional heart care, we have created designated Provider Care Teams.  These Care Teams include your primary Cardiologist (physician) and Advanced Practice Providers (APPs -  Physician Assistants and Nurse Practitioners) who all work together to provide you with the care you need, when you need it.  Your next appointment:   12 month(s)  The format for your next appointment:   In Person  Provider:   You may see Sinclair Grooms, MD or one of the following Advanced Practice Providers on your designated Care Team:    Truitt Merle, NP  Cecilie Kicks, NP  Kathyrn Drown, NP   Other Instructions

## 2019-03-13 ENCOUNTER — Other Ambulatory Visit: Payer: Self-pay

## 2019-03-13 ENCOUNTER — Other Ambulatory Visit: Payer: 59 | Admitting: *Deleted

## 2019-03-13 ENCOUNTER — Ambulatory Visit (HOSPITAL_COMMUNITY): Payer: 59 | Attending: Cardiovascular Disease

## 2019-03-13 DIAGNOSIS — E785 Hyperlipidemia, unspecified: Secondary | ICD-10-CM

## 2019-03-13 DIAGNOSIS — R011 Cardiac murmur, unspecified: Secondary | ICD-10-CM | POA: Insufficient documentation

## 2019-03-13 DIAGNOSIS — I10 Essential (primary) hypertension: Secondary | ICD-10-CM

## 2019-03-13 DIAGNOSIS — I2510 Atherosclerotic heart disease of native coronary artery without angina pectoris: Secondary | ICD-10-CM

## 2019-03-14 LAB — BASIC METABOLIC PANEL
BUN/Creatinine Ratio: 16 (ref 9–23)
BUN: 16 mg/dL (ref 6–24)
CO2: 24 mmol/L (ref 20–29)
Calcium: 9.4 mg/dL (ref 8.7–10.2)
Chloride: 102 mmol/L (ref 96–106)
Creatinine, Ser: 1.01 mg/dL — ABNORMAL HIGH (ref 0.57–1.00)
GFR calc Af Amer: 73 mL/min/{1.73_m2} (ref >59)
GFR calc non Af Amer: 63 mL/min/{1.73_m2} (ref >59)
Glucose: 88 mg/dL (ref 65–99)
Potassium: 4.1 mmol/L (ref 3.5–5.2)
Sodium: 140 mmol/L (ref 134–144)

## 2019-03-14 LAB — HEPATIC FUNCTION PANEL
ALT: 24 IU/L (ref 0–32)
AST: 40 IU/L (ref 0–40)
Albumin: 4.6 g/dL (ref 3.8–4.9)
Alkaline Phosphatase: 86 IU/L (ref 39–117)
Bilirubin Total: 0.4 mg/dL (ref 0.0–1.2)
Bilirubin, Direct: 0.13 mg/dL (ref 0.00–0.40)
Total Protein: 7.2 g/dL (ref 6.0–8.5)

## 2019-03-14 LAB — LIPID PANEL
Chol/HDL Ratio: 3 ratio (ref 0.0–4.4)
Cholesterol, Total: 207 mg/dL — ABNORMAL HIGH (ref 100–199)
HDL: 70 mg/dL (ref >39)
LDL Chol Calc (NIH): 123 mg/dL — ABNORMAL HIGH (ref 0–99)
Triglycerides: 79 mg/dL (ref 0–149)
VLDL Cholesterol Cal: 14 mg/dL (ref 5–40)

## 2019-03-14 LAB — HEMOGLOBIN A1C
Est. average glucose Bld gHb Est-mCnc: 123 mg/dL
Hgb A1c MFr Bld: 5.9 % — ABNORMAL HIGH (ref 4.8–5.6)

## 2019-03-17 ENCOUNTER — Other Ambulatory Visit: Payer: Self-pay | Admitting: *Deleted

## 2019-03-17 DIAGNOSIS — R7303 Prediabetes: Secondary | ICD-10-CM

## 2019-03-17 DIAGNOSIS — E782 Mixed hyperlipidemia: Secondary | ICD-10-CM

## 2019-04-07 ENCOUNTER — Telehealth: Payer: Self-pay | Admitting: Pharmacist

## 2019-04-07 MED ORDER — NEXLETOL 180 MG PO TABS: 1.0000 | ORAL_TABLET | Freq: Every day | ORAL | 3 refills | Status: DC

## 2019-04-07 NOTE — Telephone Encounter (Signed)
Resubmitted prior authorization for [facility] - insurance would not cover it last year since it was still non-formulary with [facility] insurance plan, even despite appeals attempt. Baseline LDL 123 above goal < 70 due to hx of ASCVD. Prior intolerances include: atorvastatin, rosuvastatin 5mg , Zetia 10mg , pravastatin 10mg  every other day, Livalo 0.5mg  every other day (elevated CK), Praluent, and Repatha - myalgias with all. Pt is aware we will call her once we hear back from her insurance.

## 2019-04-07 NOTE — Telephone Encounter (Signed)
A big thanks for persistence!!

## 2019-04-07 NOTE — Telephone Encounter (Addendum)
Nexletol prior authorization finally approved by insurance! It was added to her formulary for 2021 year - approved through 04/06/20. Rx sent to local pharmacy and $10 copay card has been activated and sent to pharmacy.  Pt aware to call with tolerability update. If she tolerates well, will schedule f/u lipid panel and CMET in 2-3 months.

## 2019-04-20 ENCOUNTER — Other Ambulatory Visit: Payer: Self-pay

## 2019-04-20 ENCOUNTER — Encounter: Payer: Self-pay | Admitting: Skilled Nursing Facility1

## 2019-04-20 ENCOUNTER — Encounter: Payer: 59 | Attending: Interventional Cardiology | Admitting: Skilled Nursing Facility1

## 2019-04-20 DIAGNOSIS — E785 Hyperlipidemia, unspecified: Secondary | ICD-10-CM

## 2019-04-20 NOTE — Progress Notes (Signed)
  Assessment:  Primary concerns today: predaibetes.   Pt states she has added 30 minutes in the morning and afternoon to her workout routine. Pt states she has started following a plant based diet. Pt states her cardiologist wants her LDL cholesterol to be between 70-80. Pt states she has had a stent In her heart placed. Pt states she has a liver reaction to cholesterol medications. Pt states she has had a kidney stone.  Pt states she does not eat something if it has more than 5 mg of cholesterol. Pt states she has been enjoying her activity cycling and walking. Pt states she walks with her 49 year old daughter. Pt states she has been following a plant based diet for the last year. Pt states she does sometimes boredom eat. Pt state her A1C 5.9.   Pt is truly doing an excellent job with her diet and lifestyle as it stands.    MEDICATIONS: see list   DIETARY INTAKE:  Usual eating pattern includes 3 meals and 2 snacks per day.  Everyday foods include   Avoided foods include beef/pork   24-hr recall:  B ( AM): oatmeal with brown sugar sometimes dried cranberries and a banana or banana and almonds Snk ( AM): 1 dark chocolate coconut bite or fruit  L ( PM): left over vegetables: squash/zucchini/onions sometimes with fish or whole wheat pasta spinach and red peppers and sun dried tomatoes  Snk ( PM): mini kind bar D ( PM): lentils with brown rice and tomato/cucmber salad Snk ( PM): skinny girl popcorn or potato chips  Beverages: water, orange juice, pom juice  Usual physical activity: cycling/biking sweaty and out of breath  Progress Towards Goal(s):  In progress.    Intervention:  Nutrition counseling for prediabetes/hypercholesterolemia  Goals: Take one day a week to relax your body; a rest day Try to take 2 calcium citrate supplements a day taken at least 2 hours from your multi and one another  Its okay to eat more fish throughout the week   Teaching Method Utilized:   Visual Auditory Hands on   Barriers to learning/adherence to lifestyle change: none identified   Demonstrated degree of understanding via:  Teach Back   Monitoring/Evaluation:  Dietary intake, exercise, and body weight prn.

## 2019-04-21 ENCOUNTER — Other Ambulatory Visit: Payer: Self-pay | Admitting: Obstetrics & Gynecology

## 2019-04-22 ENCOUNTER — Telehealth: Payer: Self-pay | Admitting: Interventional Cardiology

## 2019-04-22 NOTE — Telephone Encounter (Signed)
Patient had her first visit with the Dietitian on Monday, and the RD mentioned a diagnosis that seemed unfamiliar to her. The RD told the pt she had a fatty liver, and the pt knew nothing about that. She wanted to confirm this with Dr. Tamala Julian and his nurse, and she also wanted to discuss this dx with Dr. Tamala Julian and his Nurse

## 2019-04-22 NOTE — Telephone Encounter (Signed)
Left message to call back  

## 2019-04-22 NOTE — Telephone Encounter (Signed)
Spoke with pt and made her aware that it appears the fatty liver was a finding on an MRI her PCP ordered in 2019.  Advised pt to reach out to them about f/u on that.  Pt appreciative for call.

## 2019-08-07 ENCOUNTER — Telehealth: Payer: Self-pay | Admitting: Pharmacist

## 2019-08-07 DIAGNOSIS — R7303 Prediabetes: Secondary | ICD-10-CM

## 2019-08-07 DIAGNOSIS — E785 Hyperlipidemia, unspecified: Secondary | ICD-10-CM

## 2019-08-07 DIAGNOSIS — E782 Mixed hyperlipidemia: Secondary | ICD-10-CM

## 2019-08-07 NOTE — Telephone Encounter (Signed)
Patient called, requests 3 month follow up lab orders.  Orders placed, appointment scheduled for 5/12.

## 2019-08-12 ENCOUNTER — Other Ambulatory Visit: Payer: Self-pay

## 2019-08-12 ENCOUNTER — Other Ambulatory Visit: Payer: 59 | Admitting: *Deleted

## 2019-08-13 LAB — COMPREHENSIVE METABOLIC PANEL
ALT: 23 IU/L (ref 0–32)
AST: 55 IU/L — ABNORMAL HIGH (ref 0–40)
Albumin/Globulin Ratio: 1.9 (ref 1.2–2.2)
Albumin: 4.5 g/dL (ref 3.8–4.9)
Alkaline Phosphatase: 65 IU/L (ref 39–117)
BUN/Creatinine Ratio: 15 (ref 9–23)
BUN: 13 mg/dL (ref 6–24)
Bilirubin Total: 0.4 mg/dL (ref 0.0–1.2)
CO2: 26 mmol/L (ref 20–29)
Calcium: 9 mg/dL (ref 8.7–10.2)
Chloride: 106 mmol/L (ref 96–106)
Creatinine, Ser: 0.88 mg/dL (ref 0.57–1.00)
GFR calc Af Amer: 86 mL/min/{1.73_m2} (ref >59)
GFR calc non Af Amer: 74 mL/min/{1.73_m2} (ref >59)
Globulin, Total: 2.4 g/dL (ref 1.5–4.5)
Glucose: 90 mg/dL (ref 65–99)
Potassium: 4.3 mmol/L (ref 3.5–5.2)
Sodium: 142 mmol/L (ref 134–144)
Total Protein: 6.9 g/dL (ref 6.0–8.5)

## 2019-08-13 LAB — LIPID PANEL
Chol/HDL Ratio: 2.9 ratio (ref 0.0–4.4)
Cholesterol, Total: 172 mg/dL (ref 100–199)
HDL: 59 mg/dL (ref >39)
LDL Chol Calc (NIH): 103 mg/dL — ABNORMAL HIGH (ref 0–99)
Triglycerides: 48 mg/dL (ref 0–149)
VLDL Cholesterol Cal: 10 mg/dL (ref 5–40)

## 2019-08-13 LAB — HEMOGLOBIN A1C
Est. average glucose Bld gHb Est-mCnc: 123 mg/dL
Hgb A1c MFr Bld: 5.9 % — ABNORMAL HIGH (ref 4.8–5.6)

## 2019-08-17 ENCOUNTER — Telehealth: Payer: Self-pay | Admitting: Pharmacist

## 2019-08-17 NOTE — Telephone Encounter (Signed)
Received lab work back.  Called patient and left message on machine to discuss results

## 2019-08-17 NOTE — Telephone Encounter (Signed)
Patient called back to discuss results.  LDL and TC improving however still above goal.  A1c remains the same.  Patient reports she will continue medication as directed and will increase exercise.  Does not have cardiology appointment scheduled yet but would like to recheck lipid panel in 3 months.  Reports will call back to schedule

## 2019-10-16 ENCOUNTER — Ambulatory Visit
Admission: RE | Admit: 2019-10-16 | Discharge: 2019-10-16 | Disposition: A | Discharge status: Home / Self Care | Payer: 59 | Source: Ambulatory Visit | Attending: Internal Medicine | Admitting: Internal Medicine

## 2019-10-16 ENCOUNTER — Other Ambulatory Visit: Payer: Self-pay | Admitting: Internal Medicine

## 2019-10-16 DIAGNOSIS — M545 Low back pain, unspecified: Secondary | ICD-10-CM

## 2020-01-26 ENCOUNTER — Other Ambulatory Visit: Payer: Self-pay | Admitting: Obstetrics & Gynecology

## 2020-02-04 ENCOUNTER — Other Ambulatory Visit: Payer: Self-pay

## 2020-02-04 ENCOUNTER — Encounter (HOSPITAL_BASED_OUTPATIENT_CLINIC_OR_DEPARTMENT_OTHER): Payer: Self-pay | Admitting: Obstetrics & Gynecology

## 2020-02-04 NOTE — Progress Notes (Signed)
Spoke w/ via phone for pre-op interview--- PT Lab needs dos---- CBC, Istat              Lab results------ current ekg in epic/ chart COVID test ------ 02-08-2020 @ 0825 Arrive at ------- 0830 NPO after MN NO Solid Food.  Clear liquids from MN until--- 0730 Medications to take morning of surgery ----- NONE Diabetic medication ----- n/a Patient Special Instructions ----- n/a Pre-Op special Istructions ----- n/a Patient verbalized understanding of instructions that were given at this phone interview. Patient denies shortness of breath, chest pain, fever, cough at this phone interview.

## 2020-02-08 ENCOUNTER — Other Ambulatory Visit (HOSPITAL_COMMUNITY): Admission: RE | Admit: 2020-02-08 | Payer: 59 | Source: Ambulatory Visit

## 2020-02-10 ENCOUNTER — Other Ambulatory Visit (HOSPITAL_COMMUNITY): Payer: 59

## 2020-02-10 ENCOUNTER — Other Ambulatory Visit (HOSPITAL_COMMUNITY)
Admission: RE | Admit: 2020-02-10 | Discharge: 2020-02-10 | Disposition: A | Discharge status: Home / Self Care | Payer: 59 | Source: Ambulatory Visit | Attending: Obstetrics & Gynecology | Admitting: Obstetrics & Gynecology

## 2020-02-10 DIAGNOSIS — Z20822 Contact with and (suspected) exposure to covid-19: Secondary | ICD-10-CM | POA: Insufficient documentation

## 2020-02-10 DIAGNOSIS — Z01818 Encounter for other preprocedural examination: Secondary | ICD-10-CM | POA: Diagnosis not present

## 2020-02-10 LAB — SARS CORONAVIRUS 2 (TAT 6-24 HRS): SARS Coronavirus 2: NEGATIVE

## 2020-02-10 NOTE — H&P (Signed)
[name] is an 54 y.o. female with a history of fibroid uterus and recurrent postmenopausal bleeding here for a dilation and curettage, possible myosure.    Pertinent Gynecological History: Menses: post-menopausal Bleeding: post menopausal bleeding Contraception: post menopausal status DES exposure: unknown Blood transfusions: none Sexually transmitted diseases: no past history Previous GYN Procedures: D and C hysteroscopy with myosure Last mammogram: normal Date: 04/07/2019 Last pap: normal Date: 02/01/2016 OB History: G3, P2012  Menstrual History: Patient's last menstrual period was 09/21/2013.    Past Medical History:  Diagnosis Date  . Coronary artery disease cardiologist-  dr Daneen Schick   09-26-2010  cardiac cath w/ PCI and DES x2 to LAD  . Family history of early CAD   . Fatty liver   . Hyperlipidemia   . Hypertension   . PMB (postmenopausal bleeding)   . S/P drug eluting coronary stent placement 09/26/2010   x2 to LAD  . Uterine leiomyoma   . Wears glasses     Past Surgical History:  Procedure Laterality Date  . BREAST BIOPSY Right 12/30/2012   Procedure: CYST EXCISION BREAST;  Surgeon: Marcello Moores A. Cornett, MD;  Location: [place];  Service: General;  Laterality: Right;  . COLONOSCOPY WITH PROPOFOL  2016  . CORONARY ANGIOPLASTY WITH STENT PLACEMENT  09-26-2010   dr Daneen Schick   unstable angina w/ exertion & abnormal ETT-- PCI and DES to midLAD and distalLAD (x2 stents),  normal LVF, ef 70%  . DILATATION & CURETTAGE/HYSTEROSCOPY WITH MYOSURE N/A 09/26/2017   Procedure: DILATATION & CURETTAGE/HYSTEROSCOPY WITH MYOSURE;  Surgeon: Waymon Amato, MD;  Location: [place];  Service: Gynecology;  Laterality: N/A;    Family History  Problem Relation Age of Onset  . Heart disease Father   . Ovarian cancer Mother 76       ovarian sarcoma  . Hypertension Brother   . Hypertension Sister     Social History:  reports that she has never  smoked. She has never used smokeless tobacco. She reports that she does not drink alcohol and does not use drugs.  Allergies:  Allergies  Allergen Reactions  . Erythromycin Other (See Comments)    Stomach cramps  . Crestor [Rosuvastatin]     Severe muscle aches on Crestor 5 mg once daily  . Lipitor [Atorvastatin] Other (See Comments)    Aches in muscles/barely can move  . Livalo [Pitavastatin] Other (See Comments)    Increased CK    No medications prior to admission.  No current facility-administered medications for this encounter.  Current Outpatient Medications:  .  aspirin 81 MG tablet, Take 81 mg by mouth daily., Disp: , Rfl:  .  ELDERBERRY PO, Take by mouth daily., Disp: , Rfl:  .  lisinopril (ZESTRIL) 2.5 MG tablet, Take 2.5 mg by mouth daily., Disp: , Rfl:  .  nitroGLYCERIN (NITROSTAT) 0.4 MG SL tablet, Place 1 tablet (0.4 mg total) under the tongue every 5 (five) minutes as needed for chest pain., Disp: 25 tablet, Rfl: 3 .  Omega-3 Fatty Acids (FISH OIL) 1000 MG CAPS, Take by mouth daily., Disp: , Rfl:  .  Bempedoic Acid (NEXLETOL) 180 MG TABS, Take 1 tablet by mouth daily. (Patient not taking: Reported on 02/04/2020), Disp: 90 tablet, Rfl: 3 .  Multiple Vitamin (MULTIVITAMIN) capsule, Take 1 capsule by mouth daily., Disp: , Rfl:   Review of Systems  Constitutional: Denies fevers/chills Cardiovascular: Denies chest pain or palpitations Pulmonary: Denies coughing or wheezing Gastrointestinal: Denies nausea,  vomiting or diarrhea Genitourinary: Denies pelvic pain, unusual vaginal discharge, dysuria, urgency or frequency.  With postmenopausal bleeding.  Musculoskeletal: Denies muscle or joint aches and pain.  Neurology: Denies abnormal sensations such as tingling or numbness.   Height 5\' 5"  (1.651 m), weight 79.4 kg, last menstrual period 09/21/2013. Physical Exam  Blood pressure 135/83, pulse 65, temperature 97.8 F (36.6 C), temperature source Oral, resp. rate 16, height  5\' 5"  (1.651 m), weight 80.2 kg, last menstrual period 09/21/2013, SpO2 100 %. Constitutional: She is oriented to person, place, and time. She appears well-developed and well-nourished.  HENT:  Head: Normocephalic and atraumatic.  Neck: Normal range of motion.  Cardiovascular: Normal rate, regular rhythm and normal heart sounds.   Respiratory: Effort normal and breath sounds normal.  GI: Soft. Bowel sounds are normal.  Neurological: She is alert and oriented to person, place, and time.  Skin: Skin is warm and dry.  Psychiatric: She has a normal mood and affect. Her behavior is normal.    Pelvic Ultrasound: Uterus 7.4 x 5.5 x 6 cm. EMS 5.45 mm. 3 cm posterior mid intramural fibroid with submucosal component.  2 cm anterior subserosal fundal fibroid. 1.1 cm fundal intramural posterior fibroid.  1.3 cm anterior intramural fibroid.Normal left and right adnexa.   Endometrial biopsy: Inactive endometrium, no hyperplasia or malignancy.     Recent Results (from the past 2160 hour(s))  SARS CORONAVIRUS 2 (TAT 6-24 HRS) Nasopharyngeal Nasopharyngeal Swab     Status: None   Collection Time: 02/10/20  3:09 PM   Specimen: Nasopharyngeal Swab  Result Value Ref Range   SARS Coronavirus 2 NEGATIVE NEGATIVE    Comment: (NOTE) SARS-CoV-2 target nucleic acids are NOT DETECTED.  The SARS-CoV-2 RNA is generally detectable in upper and lower respiratory specimens during the acute phase of infection. Negative results do not preclude SARS-CoV-2 infection, do not rule out co-infections with other pathogens, and should not be used as the sole basis for treatment or other patient management decisions. Negative results must be combined with clinical observations, patient history, and epidemiological information. The expected result is Negative.  Fact Sheet for Patients: SugarRoll.be  Fact Sheet for Healthcare Providers: https://www.woods-mathews.com/  This test is  not yet approved or cleared by the [place] FDA and  has been authorized for detection and/or diagnosis of SARS-CoV-2 by FDA under an Emergency Use Authorization (EUA). This EUA will remain  in effect (meaning this test can be used) for the duration of the COVID-19 declaration under Se ction 564(b)(1) of the Act, 21 U.S.C. section 360bbb-3(b)(1), unless the authorization is terminated or revoked sooner.  Performed at [facility], [place] [address], [place], [place] [address]    02/11/2020: Urine pregnancy test: negative.   Assessment/Plan: 54 y/o Para 2 with fibroid uterus and postmenopausal bleeding, here for dilation and curettage hysteroscopy, possible myosure  Admit to Day surgery, [place] hospital NPO and IV fluids LR fluids This procedure has been fully reviewed with the patient and written informed consent has been obtained.   Archie Endo, MD.  02/10/2020, 5:50 PM

## 2020-02-11 ENCOUNTER — Encounter (HOSPITAL_BASED_OUTPATIENT_CLINIC_OR_DEPARTMENT_OTHER): Payer: Self-pay | Admitting: Obstetrics & Gynecology

## 2020-02-11 ENCOUNTER — Encounter (HOSPITAL_BASED_OUTPATIENT_CLINIC_OR_DEPARTMENT_OTHER)
Admission: RE | Disposition: A | Discharge status: Home / Self Care | Payer: Self-pay | Source: Home / Self Care | Attending: Obstetrics & Gynecology

## 2020-02-11 ENCOUNTER — Other Ambulatory Visit: Payer: Self-pay

## 2020-02-11 ENCOUNTER — Ambulatory Visit (HOSPITAL_BASED_OUTPATIENT_CLINIC_OR_DEPARTMENT_OTHER): Payer: 59 | Admitting: Anesthesiology

## 2020-02-11 ENCOUNTER — Ambulatory Visit (HOSPITAL_BASED_OUTPATIENT_CLINIC_OR_DEPARTMENT_OTHER)
Admission: RE | Admit: 2020-02-11 | Discharge: 2020-02-11 | Disposition: A | Discharge status: Home / Self Care | Payer: 59 | Attending: Obstetrics & Gynecology | Admitting: Obstetrics & Gynecology

## 2020-02-11 DIAGNOSIS — D250 Submucous leiomyoma of uterus: Secondary | ICD-10-CM | POA: Diagnosis not present

## 2020-02-11 DIAGNOSIS — N950 Postmenopausal bleeding: Secondary | ICD-10-CM | POA: Insufficient documentation

## 2020-02-11 HISTORY — DX: Essential (primary) hypertension: I10

## 2020-02-11 HISTORY — PX: DILATATION & CURETTAGE/HYSTEROSCOPY WITH MYOSURE: SHX6511

## 2020-02-11 LAB — POCT PREGNANCY, URINE: Preg Test, Ur: NEGATIVE

## 2020-02-11 LAB — CBC
HCT: 42.1 % (ref 36.0–46.0)
Hemoglobin: 13.6 g/dL (ref 12.0–15.0)
MCH: 30 pg (ref 26.0–34.0)
MCHC: 32.3 g/dL (ref 30.0–36.0)
MCV: 92.7 fL (ref 80.0–100.0)
Platelets: 181 10*3/uL (ref 150–400)
RBC: 4.54 MIL/uL (ref 3.87–5.11)
RDW: 12.7 % (ref 11.5–15.5)
WBC: 2.7 10*3/uL — ABNORMAL LOW (ref 4.0–10.5)
nRBC: 0 % (ref 0.0–0.2)

## 2020-02-11 LAB — POCT I-STAT, CHEM 8
BUN: 11 mg/dL (ref 6–20)
Calcium, Ion: 1.15 mmol/L (ref 1.15–1.40)
Chloride: 105 mmol/L (ref 98–111)
Creatinine, Ser: 0.8 mg/dL (ref 0.44–1.00)
Glucose, Bld: 80 mg/dL (ref 70–99)
HCT: 43 % (ref 36.0–46.0)
Hemoglobin: 14.6 g/dL (ref 12.0–15.0)
Potassium: 3.9 mmol/L (ref 3.5–5.1)
Sodium: 143 mmol/L (ref 135–145)
TCO2: 25 mmol/L (ref 22–32)

## 2020-02-11 SURGERY — DILATATION & CURETTAGE/HYSTEROSCOPY WITH MYOSURE
Anesthesia: Choice

## 2020-02-11 MED ORDER — OXYCODONE HCL 5 MG PO TABS: 5.0000 mg | ORAL_TABLET | Freq: Once | ORAL | Status: DC | PRN

## 2020-02-11 MED ORDER — PROPOFOL 10 MG/ML IV BOLUS
INTRAVENOUS | Status: AC
  Filled 2020-02-11: qty 20

## 2020-02-11 MED ORDER — LIDOCAINE 2% (20 MG/ML) 5 ML SYRINGE
INTRAMUSCULAR | Status: AC
  Filled 2020-02-11: qty 5

## 2020-02-11 MED ORDER — FENTANYL CITRATE (PF) 100 MCG/2ML IJ SOLN
INTRAMUSCULAR | Status: AC
  Filled 2020-02-11: qty 2

## 2020-02-11 MED ORDER — METHYLERGONOVINE MALEATE 0.2 MG/ML IJ SOLN
INTRAMUSCULAR | Status: DC | PRN
  Administered 2020-02-11: .2 mg via INTRAMUSCULAR

## 2020-02-11 MED ORDER — DEXAMETHASONE SODIUM PHOSPHATE 10 MG/ML IJ SOLN
INTRAMUSCULAR | Status: AC
  Filled 2020-02-11: qty 1

## 2020-02-11 MED ORDER — PROPOFOL 10 MG/ML IV BOLUS
INTRAVENOUS | Status: DC | PRN
  Administered 2020-02-11 (×3): 20 mg via INTRAVENOUS

## 2020-02-11 MED ORDER — PROMETHAZINE HCL 25 MG/ML IJ SOLN: 6.2500 mg | INTRAMUSCULAR | Status: DC | PRN

## 2020-02-11 MED ORDER — OXYCODONE HCL 5 MG PO TABS: 5.0000 mg | ORAL_TABLET | ORAL | 0 refills | Status: AC | PRN

## 2020-02-11 MED ORDER — IBUPROFEN 800 MG PO TABS: 800.0000 mg | ORAL_TABLET | Freq: Three times a day (TID) | ORAL | 0 refills | Status: AC | PRN

## 2020-02-11 MED ORDER — FENTANYL CITRATE (PF) 100 MCG/2ML IJ SOLN
INTRAMUSCULAR | Status: DC | PRN
  Administered 2020-02-11 (×4): 25 ug via INTRAVENOUS

## 2020-02-11 MED ORDER — KETOROLAC TROMETHAMINE 30 MG/ML IJ SOLN
INTRAMUSCULAR | Status: AC
  Filled 2020-02-11: qty 1

## 2020-02-11 MED ORDER — OXYCODONE HCL 5 MG/5ML PO SOLN: 5.0000 mg | Freq: Once | ORAL | Status: DC | PRN

## 2020-02-11 MED ORDER — ONDANSETRON HCL 4 MG/2ML IJ SOLN
INTRAMUSCULAR | Status: DC | PRN
  Administered 2020-02-11: 4 mg via INTRAVENOUS

## 2020-02-11 MED ORDER — ONDANSETRON HCL 4 MG/2ML IJ SOLN
INTRAMUSCULAR | Status: AC
  Filled 2020-02-11: qty 2

## 2020-02-11 MED ORDER — BUPIVACAINE-EPINEPHRINE 0.5% -1:200000 IJ SOLN
INTRAMUSCULAR | Status: DC | PRN
  Administered 2020-02-11: 20 mL

## 2020-02-11 MED ORDER — DEXMEDETOMIDINE (PRECEDEX) IN NS 20 MCG/5ML (4 MCG/ML) IV SYRINGE
PREFILLED_SYRINGE | INTRAVENOUS | Status: AC
  Filled 2020-02-11: qty 5

## 2020-02-11 MED ORDER — FERRIC SUBSULFATE 259 MG/GM EX SOLN
CUTANEOUS | Status: DC | PRN
  Administered 2020-02-11: 1

## 2020-02-11 MED ORDER — DEXMEDETOMIDINE HCL 200 MCG/2ML IV SOLN
INTRAVENOUS | Status: DC | PRN
  Administered 2020-02-11 (×2): 4 ug via INTRAVENOUS

## 2020-02-11 MED ORDER — SCOPOLAMINE 1 MG/3DAYS TD PT72
MEDICATED_PATCH | TRANSDERMAL | Status: AC
  Filled 2020-02-11: qty 1

## 2020-02-11 MED ORDER — MIDAZOLAM HCL 5 MG/5ML IJ SOLN
INTRAMUSCULAR | Status: DC | PRN
  Administered 2020-02-11: 2 mg via INTRAVENOUS

## 2020-02-11 MED ORDER — LIDOCAINE HCL (CARDIAC) PF 100 MG/5ML IV SOSY
PREFILLED_SYRINGE | INTRAVENOUS | Status: DC | PRN
  Administered 2020-02-11: 40 mg via INTRAVENOUS

## 2020-02-11 MED ORDER — DEXAMETHASONE SODIUM PHOSPHATE 4 MG/ML IJ SOLN
INTRAMUSCULAR | Status: DC | PRN
  Administered 2020-02-11: 5 mg via INTRAVENOUS

## 2020-02-11 MED ORDER — FENTANYL CITRATE (PF) 100 MCG/2ML IJ SOLN: 25.0000 ug | INTRAMUSCULAR | Status: DC | PRN

## 2020-02-11 MED ORDER — POVIDONE-IODINE 10 % EX SWAB: 2.0000 "application " | Freq: Once | CUTANEOUS | Status: DC

## 2020-02-11 MED ORDER — SODIUM CHLORIDE 0.9 % IR SOLN
Status: DC | PRN
  Administered 2020-02-11: 3000 mL

## 2020-02-11 MED ORDER — SILVER NITRATE-POT NITRATE 75-25 % EX MISC
CUTANEOUS | Status: DC | PRN
  Administered 2020-02-11: 4

## 2020-02-11 MED ORDER — KETOROLAC TROMETHAMINE 30 MG/ML IJ SOLN
INTRAMUSCULAR | Status: DC | PRN
  Administered 2020-02-11: 30 mg via INTRAVENOUS

## 2020-02-11 MED ORDER — MIDAZOLAM HCL 2 MG/2ML IJ SOLN
INTRAMUSCULAR | Status: AC
  Filled 2020-02-11: qty 2

## 2020-02-11 MED ORDER — METHYLERGONOVINE MALEATE 0.2 MG/ML IJ SOLN
INTRAMUSCULAR | Status: AC
  Filled 2020-02-11: qty 1

## 2020-02-11 MED ORDER — SCOPOLAMINE 1 MG/3DAYS TD PT72
MEDICATED_PATCH | TRANSDERMAL | Status: DC | PRN
  Administered 2020-02-11: 1 via TRANSDERMAL

## 2020-02-11 MED ORDER — PROPOFOL 500 MG/50ML IV EMUL
INTRAVENOUS | Status: DC | PRN
  Administered 2020-02-11: 50 ug/kg/min via INTRAVENOUS

## 2020-02-11 MED ORDER — MEPERIDINE HCL 25 MG/ML IJ SOLN: 6.2500 mg | INTRAMUSCULAR | Status: DC | PRN

## 2020-02-11 MED ORDER — KETOROLAC TROMETHAMINE 30 MG/ML IJ SOLN: 30.0000 mg | Freq: Once | INTRAMUSCULAR | Status: DC

## 2020-02-11 MED ORDER — LACTATED RINGERS IV SOLN: INTRAVENOUS | Status: DC

## 2020-02-11 SURGICAL SUPPLY — 19 items
CANISTER SUCT 3000ML PPV (MISCELLANEOUS) ×2 IMPLANT
CATH ROBINSON RED A/P 16FR (CATHETERS) ×2 IMPLANT
COVER WAND RF STERILE (DRAPES) ×2 IMPLANT
DEVICE MYOSURE LITE (MISCELLANEOUS) IMPLANT
DEVICE MYOSURE REACH (MISCELLANEOUS) IMPLANT
DILATOR CANAL MILEX (MISCELLANEOUS) IMPLANT
GAUZE 4X4 16PLY RFD (DISPOSABLE) ×2 IMPLANT
GLOVE BIOGEL PI IND STRL 7.0 (GLOVE) ×2 IMPLANT
GLOVE BIOGEL PI INDICATOR 7.0 (GLOVE) ×2
GLOVE SURG SS PI 6.5 STRL IVOR (GLOVE) ×2 IMPLANT
GOWN STRL REUS W/TWL LRG LVL3 (GOWN DISPOSABLE) ×4 IMPLANT
KIT PROCEDURE FLUENT (KITS) ×2 IMPLANT
MYOSURE XL FIBROID (MISCELLANEOUS) ×2
PACK VAGINAL MINOR WOMEN LF (CUSTOM PROCEDURE TRAY) ×2 IMPLANT
PAD OB MATERNITY 4.3X12.25 (PERSONAL CARE ITEMS) ×2 IMPLANT
SEAL CERVICAL OMNI LOK (ABLATOR) IMPLANT
SEAL ROD LENS SCOPE MYOSURE (ABLATOR) ×2 IMPLANT
SWAB OB GYN 8IN STERILE 2PK (MISCELLANEOUS) ×2 IMPLANT
SYSTEM TISS REMOVAL MYOSURE XL (MISCELLANEOUS) ×1 IMPLANT

## 2020-02-11 NOTE — Anesthesia Preprocedure Evaluation (Addendum)
Anesthesia Evaluation  Patient identified by MRN, date of birth, ID band Patient awake    Reviewed: Allergy & Precautions, NPO status , Patient's Chart, lab work & pertinent test results  Airway Mallampati: II  TM Distance: >3 FB Neck ROM: Full    Dental  (+) Dental Advisory Given, Teeth Intact   Pulmonary neg pulmonary ROS,    Pulmonary exam normal breath sounds clear to auscultation       Cardiovascular hypertension, Pt. on medications + CAD  Normal cardiovascular exam+ Valvular Problems/Murmurs  Rhythm:Regular Rate:Normal  Echo 03/2019 1. Left ventricular ejection fraction, by visual estimation, is 60 to 65%. The left ventricle has normal function. There is no left ventricular hypertrophy.  2. The left ventricle has no regional wall motion abnormalities.  3. Global right ventricle has normal systolic function.The right ventricular size is normal. No increase in right ventricular wall thickness.  4. Left atrial size was normal.  5. Right atrial size was normal.  6. The mitral valve is normal in structure. No evidence of mitral valve regurgitation. No evidence of mitral stenosis.  7. The tricuspid valve is normal in structure. Tricuspid valve regurgitation is not demonstrated.  8. The aortic valve is normal in structure. Aortic valve regurgitation is not visualized. No evidence of aortic valve sclerosis or stenosis.  9. The pulmonic valve was normal in structure. Pulmonic valve regurgitation is not visualized.  10. Mildly elevated pulmonary artery systolic pressure.  11. The inferior vena cava is normal in size with greater than 50% respiratory variability, suggesting right atrial pressure of 3 mmHg.    Neuro/Psych negative neurological ROS     GI/Hepatic negative GI ROS, Neg liver ROS,   Endo/Other  negative endocrine ROS  Renal/GU negative Renal ROS     Musculoskeletal negative musculoskeletal ROS (+)    Abdominal   Peds  Hematology negative hematology ROS (+)   Anesthesia Other Findings   Reproductive/Obstetrics                           Anesthesia Physical Anesthesia Plan  ASA: III  Anesthesia Plan: MAC   Post-op Pain Management:    Induction: Intravenous  PONV Risk Score and Plan: Midazolam, Treatment may vary due to age or medical condition, Dexamethasone and Ondansetron  Airway Management Planned: Natural Airway  Additional Equipment: None  Intra-op Plan:   Post-operative Plan:   Informed Consent: I have reviewed the patients History and Physical, chart, labs and discussed the procedure including the risks, benefits and alternatives for the proposed anesthesia with the patient or authorized representative who has indicated his/her understanding and acceptance.     Dental advisory given  Plan Discussed with: CRNA  Anesthesia Plan Comments:        Anesthesia Quick Evaluation

## 2020-02-11 NOTE — Anesthesia Procedure Notes (Signed)
Procedure Name: MAC Date/Time: 02/11/2020 10:36 AM Performed by: Justice Rocher, CRNA Pre-anesthesia Checklist: Patient identified, Emergency Drugs available, Suction available, Patient being monitored and Timeout performed Patient Re-evaluated:Patient Re-evaluated prior to induction Oxygen Delivery Method: Simple face mask Preoxygenation: Pre-oxygenation with 100% oxygen Induction Type: IV induction Placement Confirmation: CO2 detector,  positive ETCO2 and breath sounds checked- equal and bilateral

## 2020-02-11 NOTE — Transfer of Care (Signed)
Immediate Anesthesia Transfer of Care Note  Patient: [name]  Procedure(s) Performed: Procedure(s) (LRB): DILATATION & CURETTAGE/HYSTEROSCOPY WITH MYOSURE (N/A)  Patient Location: PACU  Anesthesia Type: General  Level of Consciousness: awake, sedated, patient cooperative and responds to stimulation  Airway & Oxygen Therapy: Patient Spontanous Breathing and Patient on RA and soft FM   Post-op Assessment: Report given to PACU RN, Post -op Vital signs reviewed and stable and Patient moving all extremities  Post vital signs: Reviewed and stable  Complications: No apparent anesthesia complications

## 2020-02-11 NOTE — Anesthesia Postprocedure Evaluation (Signed)
Anesthesia Post Note  Patient: [name]  Procedure(s) Performed: DILATATION & CURETTAGE/HYSTEROSCOPY WITH MYOSURE (N/A )     Patient location during evaluation: PACU Anesthesia Type: MAC Level of consciousness: patient cooperative and sedated Pain management: pain level controlled Vital Signs Assessment: post-procedure vital signs reviewed and stable Respiratory status: spontaneous breathing Cardiovascular status: stable Anesthetic complications: no   No complications documented.  Last Vitals:  Vitals:   02/11/20 1230 02/11/20 1300  BP: (!) 153/84 (!) 144/91  Pulse:  (!) 53  Resp: 10 16  Temp:  36.7 C  SpO2: 100% 100%    Last Pain:  Vitals:   02/11/20 1300  TempSrc:   PainSc: 0-No pain                 Nolon Nations

## 2020-02-11 NOTE — Interval H&P Note (Signed)
History and Physical Interval Note:  02/11/2020 10:13 AM  [name]  has presented today for surgery, with the diagnosis of submucosal fibroid.  The various methods of treatment have been discussed with the patient and family. After consideration of risks, benefits and other options for treatment, the patient has consented to  Procedure(s): [name] (N/A) as a surgical intervention.  The patient's history has been reviewed, patient examined, no change in status, stable for surgery.  I have reviewed the patient's chart and labs.  Questions were answered to the patient's satisfaction.     Archie Endo. MD. 02/11/2020.

## 2020-02-11 NOTE — Discharge Instructions (Signed)
  [name], 1. Nothing in vagina x 2 weeks i.e no intercourse, no tampons, no douching.    2. Expect some vaginal bleeding for next several days, call me if with excessive bleeding requiring you to change a pad every hour or two.  3. You may also see brown/silver/black residue on your pad, this is from agents used to control bleeding during the procedure, this residue with taper off with time so you do not have to worry about it.   4.  Expect some abdominal cramping, take tylenol ES as needed for pain and you may add ibuprofen and oxycodone that have been prescribed as needed.  Call me if pain is intolerable despite medication use. 5.  Take it easy rest of the day tomorrow but tomorrow you may resume light activity then regular activity later as tolerated.  6.  Call me with any questions.  Dr. Alesia Richards.  Office: [phone] Extension: [phone] If after hours please speak to provider on call in the prompts.   Post Anesthesia Home Care Instructions  Activity: Get plenty of rest for the remainder of the day. A responsible adult should stay with you for 24 hours following the procedure.  For the next 24 hours, DO NOT: -Drive a car -Paediatric nurse -Drink alcoholic beverages -Take any medication unless instructed by your physician -Make any legal decisions or sign important papers.  Meals: Start with liquid foods such as gelatin or soup. Progress to regular foods as tolerated. Avoid greasy, spicy, heavy foods. If nausea and/or vomiting occur, drink only clear liquids until the nausea and/or vomiting subsides. Call your physician if vomiting continues.  Special Instructions/Symptoms: Your throat may feel dry or sore from the anesthesia or the breathing tube placed in your throat during surgery. If this causes discomfort, gargle with warm salt water. The discomfort should disappear within 24 hours.  If you had a scopolamine patch placed behind your ear for the management of post- operative nausea  and/or vomiting:  1. The medication in the patch is effective for 72 hours, after which it should be removed.  Wrap patch in a tissue and discard in the trash. Wash hands thoroughly with soap and water. 2. You may remove the patch earlier than 72 hours if you experience unpleasant side effects which may include dry mouth, dizziness or visual disturbances. 3. Avoid touching the patch. Wash your hands with soap and water after contact with the patch.

## 2020-02-11 NOTE — H&P (Signed)
H & P Addendum: Patient is here for dilation and currettage hysteroscopy and myosure for fibroids and postmenopausal bleeding.  Dr. Alesia Richards. 02/11/2020.

## 2020-02-11 NOTE — Op Note (Addendum)
Patient: [name], [name] DOB: 11-19-1965  MRN: [id number]   Date of procedure: 02/11/2020  Pre op diagnosis:  1.Postmenopausal bleeding.  2.Uterine fibroids.       Post operative diagnosis: Same as above  Procedures: Dilation and Currettage hysteroscopy with Myosure.   Surgeon: Dr. Waymon Amato  Scrub technician:Cierra   Anesthesia: Monitored Anesthesia care  Complications: None  Fluid deficit: 1450 cc  Urine: 210 cc straight catheterization  EBL: 20 cc  Indications: 54 y/o P2 with a history of uterine fibroids and recurrent postmenopausal bleeding here for dilation and currettage hysteroscopy with myosure.  An office biopsy had shown atrophic endometrium, no dysplasia or hyperplasia.     Procedure: Informed consent was obtained from the patient to undergo the procedure including risks of bleeding, infection and damage to organs.  She was taken to the operating room and anesthesia was administered without difficulty.  She was prepped and draped in the usual sterile fashion. She was straight catheterized.  An exam was then performed under anesthesia revealing a small anteverted uterus and a closed cervix. There were no palpable adnexal masses.  A graves speculum was used to view the cervix. Single-tooth tenaculum was placed on the cervix. A paracervical block with 0.25% marcaine with epinephrine was administered, total 20cc used.  Endocervical curettings were obtained.  The cervix was dilated to #19 pratt dilators and the hysteroscope was advanced through the internal cervical os. The uterine cavity and uterus was noted to have a 3 cm submucosal lesion on the left lower posterior wall.  This lesion was shaved off with the Myosure EXCEL under direct visualization without any complications. Normal tubal ostia were noted during the procedure. No other lesions were noted.  Hysteroscopy instruments were removed.  Small brisk bleeding was noted from the cervix and methergine IM was administered by  anesthesia with cessation of bleeding.  Sharp curettage of endometrium was then performed.  Tenaculum was removed and silver nitrate and monsel's used for hemostasis at at tenaculum sites.  All instruments were then removed and the patient was awoken from anesthesia and taken to recovery room in stable condition  Specimen:  1. Endometrial shavings of fibroids. 2. Endometrial curettings.  3. Endocervical curettings.    Disposition: Stable to PACU.  Dr. Alesia Richards. 02/11/2020. 12:03 PM.

## 2020-02-12 ENCOUNTER — Encounter (HOSPITAL_BASED_OUTPATIENT_CLINIC_OR_DEPARTMENT_OTHER): Payer: Self-pay | Admitting: Obstetrics & Gynecology

## 2020-02-12 LAB — SURGICAL PATHOLOGY

## 2020-03-28 NOTE — Progress Notes (Signed)
Cardiology Office Note:    Date:  03/31/2020   ID:  [name], DOB [birth year], MRN [id number]  PCP:  Wenda Low, MD  Cardiologist:  Sinclair Grooms, MD   Referring MD: Wenda Low, MD   Chief Complaint  Patient presents with  . Coronary Artery Disease  . Hyperlipidemia  . Hypertension    History of Present Illness:    [name] is a 54 y.o. female with a hx of CAD, LAD DES 2in 2012, hypertension, and hyperlipidemia.  Under stress at school.  Wrapping up her BS and.  Not exercising.  Not on statin therapy or any antihyperlipidemic.  She is not having chest pain or any clinical symptoms.  She she is working and in school at the same time and in the winter, has no time to exercise regularly.  Past Medical History:  Diagnosis Date  . Coronary artery disease cardiologist-  dr Daneen Schick   09-26-2010  cardiac cath w/ PCI and DES x2 to LAD  . Family history of early CAD   . Fatty liver   . Hyperlipidemia   . Hypertension   . PMB (postmenopausal bleeding)   . S/P drug eluting coronary stent placement 09/26/2010   x2 to LAD  . Uterine leiomyoma   . Wears glasses     Past Surgical History:  Procedure Laterality Date  . BREAST BIOPSY Right 12/30/2012   Procedure: CYST EXCISION BREAST;  Surgeon: Marcello Moores A. Cornett, MD;  Location: [place];  Service: General;  Laterality: Right;  . COLONOSCOPY WITH PROPOFOL  2016  . CORONARY ANGIOPLASTY WITH STENT PLACEMENT  09-26-2010   dr Daneen Schick   unstable angina w/ exertion & abnormal ETT-- PCI and DES to midLAD and distalLAD (x2 stents),  normal LVF, ef 70%  . DILATATION & CURETTAGE/HYSTEROSCOPY WITH MYOSURE N/A 09/26/2017   Procedure: DILATATION & CURETTAGE/HYSTEROSCOPY WITH MYOSURE;  Surgeon: Waymon Amato, MD;  Location: [facility];  Service: Gynecology;  Laterality: N/A;  . DILATATION & CURETTAGE/HYSTEROSCOPY WITH MYOSURE N/A 02/11/2020   Procedure: DILATATION & CURETTAGE/HYSTEROSCOPY  WITH MYOSURE;  Surgeon: Waymon Amato, MD;  Location: [place];  Service: Gynecology;  Laterality: N/A;    Current Medications: Current Meds  Medication Sig  . aspirin 81 MG tablet Take 81 mg by mouth daily.  Marland Kitchen ELDERBERRY PO Take by mouth daily.  Marland Kitchen ibuprofen (ADVIL) 800 MG tablet Take 1 tablet (800 mg total) by mouth every 8 (eight) hours as needed for moderate pain or cramping.  Marland Kitchen lisinopril (ZESTRIL) 2.5 MG tablet Take 2.5 mg by mouth daily.  . Multiple Vitamin (MULTIVITAMIN) capsule Take 1 capsule by mouth daily.  . nitroGLYCERIN (NITROSTAT) 0.4 MG SL tablet Place 1 tablet (0.4 mg total) under the tongue every 5 (five) minutes as needed for chest pain.  . Omega-3 Fatty Acids (FISH OIL) 1000 MG CAPS Take by mouth daily.     Allergies:   Erythromycin, Crestor [rosuvastatin], Lipitor [atorvastatin], and Livalo [pitavastatin]   Social History   Socioeconomic History  . Marital status: Divorced    Spouse name: Not on file  . Number of children: Not on file  . Years of education: Not on file  . Highest education level: Not on file  Occupational History  . Not on file  Tobacco Use  . Smoking status: Never Smoker  . Smokeless tobacco: Never Used  Vaping Use  . Vaping Use: Never used  Substance and Sexual Activity  . Alcohol  use: No  . Drug use: No  . Sexual activity: Yes    Birth control/protection: Other-see comments    Comment: PARTNER HAD VAS.  Other Topics Concern  . Not on file  Social History Narrative  . Not on file   Social Determinants of Health   Financial Resource Strain: Not on file  Food Insecurity: Not on file  Transportation Needs: Not on file  Physical Activity: Not on file  Stress: Not on file  Social Connections: Not on file     Family History: The patient's family history includes Heart disease in her father; Hypertension in her brother and sister; Ovarian cancer (age of onset: 66) in her mother.  ROS:   Please see the history of  present illness.    She feels somewhat down because she has not been able to take care of herself the way she would like to.  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  All other systems reviewed and are negative.  EKGs/Labs/Other Studies Reviewed:    The following studies were reviewed today: No new data  EKG:  EKG reviewed  Recent Labs: 08/12/2019: ALT 23 02/11/2020: BUN 11; Creatinine, Ser 0.80; Hemoglobin 14.6; Platelets 181; Potassium 3.9; Sodium 143  Recent Lipid Panel    Component Value Date/Time   CHOL 172 08/12/2019 0747   TRIG 48 08/12/2019 0747   HDL 59 08/12/2019 0747   CHOLHDL 2.9 08/12/2019 0747   CHOLHDL 2.1 10/31/2015 0800   VLDL 13 10/31/2015 0800   LDLCALC 103 (H) 08/12/2019 0747    Physical Exam:    VS:  BP (!) 142/82   Pulse 85   Ht 5\' 5"  (1.651 m)   Wt 179 lb (81.2 kg)   LMP 09/21/2013   SpO2 99%   BMI 29.79 kg/m     Wt Readings from Last 3 Encounters:  03/31/20 179 lb (81.2 kg)  02/11/20 176 lb 14.4 oz (80.2 kg)  03/02/19 176 lb 1.9 oz (79.9 kg)     GEN: Moderate obesity. No acute distress HEENT: Normal NECK: No JVD. LYMPHATICS: No lymphadenopathy CARDIAC: 2/6 right upper sternal systolic murmur. RRR no gallop, or edema. VASCULAR:  Normal Pulses. No bruits. RESPIRATORY:  Clear to auscultation without rales, wheezing or rhonchi  ABDOMEN: Soft, non-tender, non-distended, No pulsatile mass, MUSCULOSKELETAL: No deformity  SKIN: Warm and dry NEUROLOGIC:  Alert and oriented x 3 PSYCHIATRIC:  Normal affect   ASSESSMENT:    1. Coronary artery disease involving native coronary artery of native heart without angina pectoris   2. Hyperlipidemia, unspecified hyperlipidemia type   3. Prediabetes   4. Essential hypertension, benign   5. Educated about COVID-19 virus infection    PLAN:    In order of problems listed above:  1. Risk factor  modification is lapsing due to the stressors of life.  She is unable to tolerate statin therapy, bempedoic acid, and cannot afford PCSK9 therapy.  Currently taking omega-3 fish oil and hoping to be able to control her lipid levels with diet and exercise.  Last LDL cholesterol was 112 in November 2021.  Continue aspirin, 1 daily. 2. Plant-based diet with increased vegetables and fruit.  Decrease carbohydrate. 3. Hemoglobin A1c 5.9 in November.  Plant-based diet physical activity recommended. 4. Blood pressure borderline elevated.  Recommend a low-salt diet.  Continue Zestril.  Target is 130/80 mmHg or  less 5. Vaccinated and boosted..   Medication Adjustments/Labs and Tests Ordered: Current medicines are reviewed at length with the patient today.  Concerns regarding medicines are outlined above.  No orders of the defined types were placed in this encounter.  No orders of the defined types were placed in this encounter.   Patient Instructions  Medication Instructions:  Your physician recommends that you continue on your current medications as directed. Please refer to the Current Medication list given to you today.  *If you need a refill on your cardiac medications before your next appointment, please call your pharmacy*   Lab Work: None If you have labs (blood work) drawn today and your tests are completely normal, you will receive your results only by: Marland Kitchen MyChart Message (if you have MyChart) OR . A paper copy in the mail If you have any lab test that is abnormal or we need to change your treatment, we will call you to review the results.   Testing/Procedures: None   Follow-Up: At [facility], you and your health needs are our priority.  As part of our continuing mission to provide you with exceptional heart care, we have created designated Provider Care Teams.  These Care Teams include your primary Cardiologist (physician) and Advanced Practice Providers (APPs -  Physician  Assistants and Nurse Practitioners) who all work together to provide you with the care you need, when you need it.  We recommend signing up for the patient portal called "MyChart".  Sign up information is provided on this After Visit Summary.  MyChart is used to connect with patients for Virtual Visits (Telemedicine).  Patients are able to view lab/test results, encounter notes, upcoming appointments, etc.  Non-urgent messages can be sent to your provider as well.   To learn more about what you can do with MyChart, go to [email or web address].    Your next appointment:   1 year(s)  The format for your next appointment:   In Person  Provider:   You may see Lesleigh Noe, MD or one of the following Advanced Practice Providers on your designated Care Team:    Norma Fredrickson, NP  Nada Boozer, NP  Georgie Chard, NP    Other Instructions      Signed, Lesleigh Noe, MD  03/31/2020 4:49 PM    [facility]

## 2020-03-31 ENCOUNTER — Encounter: Payer: Self-pay | Admitting: Interventional Cardiology

## 2020-03-31 ENCOUNTER — Ambulatory Visit: Payer: 59 | Admitting: Interventional Cardiology

## 2020-03-31 ENCOUNTER — Other Ambulatory Visit: Payer: Self-pay

## 2020-03-31 VITALS — BP 142/82 | HR 85 | Ht 65.0 in | Wt 179.0 lb

## 2020-03-31 DIAGNOSIS — E785 Hyperlipidemia, unspecified: Secondary | ICD-10-CM

## 2020-03-31 DIAGNOSIS — Z7189 Other specified counseling: Secondary | ICD-10-CM

## 2020-03-31 DIAGNOSIS — I10 Essential (primary) hypertension: Secondary | ICD-10-CM | POA: Diagnosis not present

## 2020-03-31 DIAGNOSIS — I2510 Atherosclerotic heart disease of native coronary artery without angina pectoris: Secondary | ICD-10-CM | POA: Diagnosis not present

## 2020-03-31 DIAGNOSIS — R7303 Prediabetes: Secondary | ICD-10-CM

## 2020-03-31 NOTE — Patient Instructions (Signed)
Medication Instructions:  Your physician recommends that you continue on your current medications as directed. Please refer to the Current Medication list given to you today.  *If you need a refill on your cardiac medications before your next appointment, please call your pharmacy*   Lab Work: None If you have labs (blood work) drawn today and your tests are completely normal, you will receive your results only by: . MyChart Message (if you have MyChart) OR . A paper copy in the mail If you have any lab test that is abnormal or we need to change your treatment, we will call you to review the results.   Testing/Procedures: None   Follow-Up: At [facility], you and your health needs are our priority.  As part of our continuing mission to provide you with exceptional heart care, we have created designated Provider Care Teams.  These Care Teams include your primary Cardiologist (physician) and Advanced Practice Providers (APPs -  Physician Assistants and Nurse Practitioners) who all work together to provide you with the care you need, when you need it.  We recommend signing up for the patient portal called "MyChart".  Sign up information is provided on this After Visit Summary.  MyChart is used to connect with patients for Virtual Visits (Telemedicine).  Patients are able to view lab/test results, encounter notes, upcoming appointments, etc.  Non-urgent messages can be sent to your provider as well.   To learn more about what you can do with MyChart, go to [email or web address].    Your next appointment:   1 year(s)  The format for your next appointment:   In Person  Provider:   You may see Henry W Smith III, MD or one of the following Advanced Practice Providers on your designated Care Team:    Lori Gerhardt, NP  Laura Ingold, NP  Jill McDaniel, NP    Other Instructions   

## 2020-05-03 ENCOUNTER — Telehealth: Payer: Self-pay | Admitting: Pharmacist

## 2020-05-03 NOTE — Telephone Encounter (Signed)
Left message for pt to see if she is interested in trying Leqvio for her cholesterol. If so, she will need to sign pt portal form so that they can complete benefits investigation.

## 2020-05-04 NOTE — Telephone Encounter (Signed)
Pt returned call to clinic. She is interested in [place]. Emailed her form to sign so that we can begin benefits investigation.

## 2020-06-13 ENCOUNTER — Telehealth: Payer: Self-pay | Admitting: Pharmacist

## 2020-06-13 DIAGNOSIS — I2510 Atherosclerotic heart disease of native coronary artery without angina pectoris: Secondary | ICD-10-CM

## 2020-06-13 NOTE — Telephone Encounter (Signed)
Called pt and left message to discuss Leqvio. I emailed her the pt portal form 6 weeks ago but didn't receive an email back with her signature to pursue benefit investigation.

## 2020-06-13 NOTE — Telephone Encounter (Signed)
Pt returned call stating she did email back her signed form after we spoke last month. Asked pt to email this again which she will do tonight.

## 2020-06-17 NOTE — Telephone Encounter (Signed)
Benefit investigation for [facility] Choice Plus plan will cover Leqvio at 100% upon prior authorization approval.  Called [employer] specialty at [phone] to initiate prior authorization request which has been submitted. Follow up clinical documentation has been faxed to [phone]. Will await determination.

## 2020-06-21 NOTE — Telephone Encounter (Addendum)
Prior intolerances include: atorvastatin, rosuvastatin 5mg  daily, pravastatin 10mg  every other day, pitavastatin 0.5mg  every other day (elevated CK), Zetia 10mg , Praluent, Repatha, and Nexletol - myalgias with all. Aggressive LDL goal < 55 given premature disease (PCI s/p DES x2 in 2012) per ESC and AACE/ACE guidelines. Most recent lipid panel from 05/26/20: TC 196, HDL 62, LDL 122, TG 65 on no lipid lowering therapy due to multiple intolerances.  Spoke with Dr Danelle Earthly for appeals who stated that [facility] prior authorization was denied because pt's policy excludes new medications from their plan until they are up for formulary review which may be as early as July 2022. She stated we can submit an appeals letter with all prior intolerances as her insurance plan will sometimes make exceptions for life or death situations. Given patient's prior intolerances to all other lipid lowering medications and her premature disease, treating her lipids is urgent. Will submit appeals letter.

## 2020-06-21 NOTE — Telephone Encounter (Signed)
Marcelle Overlie D, RPH-CPP 17 hours ago (2:02 PM)          Insurance called back. Now state peer to peer can be done with a pharmacist. Apt availability was tomorrow at 10:30 with Dr. Danelle Earthly.  I have provided my name along with [place] name since I have a 10:30 f/u apt tomorrow. Can fax additional clinical info to [phone] Call [phone] with any questions

## 2020-06-28 NOTE — Telephone Encounter (Signed)
Called for status update on appeals, still in process and determination should be made by 4/5. # to reach correct department is [phone], option 1 for medical department, then authorizations. Call reference # was [id number].

## 2020-07-07 MED ORDER — COLESEVELAM HCL 3.75 G PO PACK: 3.7500 g | PACK | Freq: Every day | ORAL | 11 refills | Status: DC

## 2020-07-07 NOTE — Telephone Encounter (Signed)
Called insurance to follow up with appeals status. Denial was upheld - [name] is a new to market med excluded until date of med is reviewed. Will try submitting another request in ~3 months. Pt inquires if there is anything else she can try. TG < 125 so insurance will not cover Vascepa. Will start pt on Welchol 3.75g powder daily as well as OTC CholestOff plant sterols/stanols. Will recheck lipids in 3 months. Pt appreciative for assistance.

## 2020-07-07 NOTE — Addendum Note (Signed)
Addended by: Kingston Shawgo E on: 07/07/2020 04:09 PM   Modules accepted: Orders

## 2020-07-11 ENCOUNTER — Telehealth: Payer: Self-pay | Admitting: Pharmacist

## 2020-07-11 NOTE — Telephone Encounter (Signed)
Pt called in about Welchol cost - expensive at $70 per month. I was able to activate a copay card to bring her cost down to $10 at the pharmacy.

## 2020-10-05 ENCOUNTER — Other Ambulatory Visit: Payer: Self-pay

## 2020-10-05 ENCOUNTER — Other Ambulatory Visit: Payer: 59

## 2020-10-05 DIAGNOSIS — I2510 Atherosclerotic heart disease of native coronary artery without angina pectoris: Secondary | ICD-10-CM

## 2020-10-05 LAB — LIPID PANEL
Chol/HDL Ratio: 3.1 ratio (ref 0.0–4.4)
Cholesterol, Total: 198 mg/dL (ref 100–199)
HDL: 64 mg/dL (ref >39)
LDL Chol Calc (NIH): 122 mg/dL — ABNORMAL HIGH (ref 0–99)
Triglycerides: 64 mg/dL (ref 0–149)
VLDL Cholesterol Cal: 12 mg/dL (ref 5–40)

## 2020-10-05 LAB — HEPATIC FUNCTION PANEL
ALT: 20 IU/L (ref 0–32)
AST: 47 IU/L — ABNORMAL HIGH (ref 0–40)
Albumin: 4.6 g/dL (ref 3.8–4.9)
Alkaline Phosphatase: 80 IU/L (ref 44–121)
Bilirubin Total: 0.3 mg/dL (ref 0.0–1.2)
Bilirubin, Direct: 0.11 mg/dL (ref 0.00–0.40)
Total Protein: 7.1 g/dL (ref 6.0–8.5)

## 2020-10-07 ENCOUNTER — Telehealth: Payer: Self-pay | Admitting: Pharmacist

## 2020-10-07 NOTE — Telephone Encounter (Signed)
New prior authorization submitted for Leqvio. See phone note 3/14 for extensive details regarding lipid history and management for pt. Authorization request called into [phone]. Clinical documentation faxed to [phone], authorization request # [id number].  Left message for pt to discuss lipid results (LDL did not respond to [facility], actually went up). I will let her know when I hear back regarding Leqvio coverage.

## 2020-10-10 MED ORDER — LEQVIO 284 MG/1.5ML ~~LOC~~ SOSY: 284.0000 mg | PREFILLED_SYRINGE | Freq: Once | SUBCUTANEOUS | 0 refills | Status: AC

## 2020-10-10 NOTE — Addendum Note (Signed)
Addended by: Mehki Klumpp E on: 10/10/2020 02:58 PM   Modules accepted: Orders

## 2020-10-10 NOTE — Telephone Encounter (Addendum)
Leqvio prior authorization approved, however her insurance requires her to fill with [facility] specialty pharmacy, she cannot have rx filled using buy and bill at [facility] infusion center. Rx sent to pharmacy, will call in copay card info as well (ID [id number], BIN [id number], PCN [place], [place] [id number]).  Called pharmacy, provided them with pt insurance info and copay card info. They stated it will take 24-48 hours before they can let us know copay. They will also need to call pt to confirm medication. Sounds like med will be shipped to our office.  Pt aware of progress. She also notes she stopped taking Welchol due to GI issues and has not been taking Cholestoff. Med list updated.

## 2020-10-10 NOTE — Telephone Encounter (Signed)
Patient called back to see if we knew what her copay would be for [facility]

## 2020-10-10 NOTE — Addendum Note (Signed)
Addended by: Fuquan Wilson E on: 10/10/2020 04:16 PM   Modules accepted: Orders

## 2020-10-12 NOTE — Telephone Encounter (Addendum)
Called [facility] pharmacy for price update. They stated [facility] is being [facility] because [facility] is transitioning over to that pharmacy. Unclear why they would not advise me of this when I called them and provided them with all pt info and copay card info.  They [facility] to [facility] at [phone], however they transferred me incorrectly to a medical alert line. Called [facility] back. They did not receive any information about a transferred prescription for Leqvio. They stated they do not have access to order [facility] and that I would need to contact [employer].  Called [facility] again. Transferred to correct dept then was hung up on. Called again. They stated they are closing and [facility], they also stated they cannot order medication and that I would need to call her insurance. I called her insurance again and was on the phone with them for 20 minutes before they also hung up on me. Called back and now have been transferred an additional 3x with no one being able to assist. Finally got through to someone. They apparently cannot update the service provider that they selected. The PA info all needs to be carried into a new authorization request. They stated [facility] specialty [facility] is another option for her to fill with.  2 hours spent dealing with Leqvio that's already been approved to get her insurance to change the pharmacy they mandated she fill at that's closing. Will await new approval letter with new pharmacy.

## 2020-10-13 NOTE — Telephone Encounter (Signed)
Josh at [facility] [phone] called for you.  [facility] purchased [facility] and he may be able to answer your Leqvio questions

## 2020-10-13 NOTE — Telephone Encounter (Addendum)
I was advised by [facility] yesterday that they are unable to order [place]. I called back to pt's insurance who had to resubmit the prior authorization to allow for [facility] specialty pharmacy to dispense the medication instead. I am waiting on the approval letter. I do not have any questions for [facility] since they advised they couldn't fill Leqvio rx.

## 2020-10-14 MED ORDER — LEQVIO 284 MG/1.5ML ~~LOC~~ SOSY
PREFILLED_SYRINGE | SUBCUTANEOUS | 3 refills | Status: DC
Start: 2020-10-14 — End: 2020-10-18

## 2020-10-14 NOTE — Addendum Note (Signed)
Addended by: Renaud Celli E on: 10/14/2020 01:01 PM   Modules accepted: Orders

## 2020-10-14 NOTE — Telephone Encounter (Signed)
Provided copay card info to [facility] specialty pharmacy. Will need to call back next week to ensure rx is processed.

## 2020-10-17 NOTE — Telephone Encounter (Addendum)
Called [facility], they state that they cannot order Leqvio either and that pt should fill at local pharmacy. Advised them that local pharmacies cannot order [name] and that her insurance mandates she fill at specialty pharmacy.  [name], pt access specialist for Leqvio with [employer] since I can't get the pt her Leqvio when 3 specialty pharmacies have now told me they cannot order Leqvio, and it's a 2 hour phone call to have her insurance change the prescribing pharmacy which I do not have the time to keep calling. He states that [facility] and [facility] are supposed to be able to order Leqvio. Advised him I have spoke with both pharmacies who tell me they cannot. He will ask corporate and let me know what he finds out.

## 2020-10-18 MED ORDER — LEQVIO 284 MG/1.5ML ~~LOC~~ SOSY
PREFILLED_SYRINGE | SUBCUTANEOUS | 3 refills | Status: DC
Start: 2020-10-18 — End: 2020-11-08

## 2020-10-18 NOTE — Addendum Note (Signed)
Addended by: Deisha Stull E on: 10/18/2020 07:18 AM   Modules accepted: Orders

## 2020-10-18 NOTE — Telephone Encounter (Addendum)
Received message back from Wes, apparently [facility] is the only larger chain specialty pharmacy that can order Leqvio. Will need to call insurance to have then change authorization approval for the 3rd time before pharmacy will be able to run rx through her insurance.  Called pt's insurance. Apparently whoever I spoke with last week never updated her approval letter with changed pharmacy to [place], it's still listed as [facility]. Advised them again that their request pharmacy is going out of business and needs to be changed, this time to [facility] or ideally just get rid of insurance restriction to single dispensing pharmacy.   Another 45 minutes spent on this call to just change the dispensing pharmacy on the PA approval. New authorization [id number], authorization pending, asked that approval letter be faxed to our office.

## 2020-10-20 NOTE — Telephone Encounter (Signed)
Called [facility] pharmacy to make sure they can process Leqvio prescription. Was told that they do not see any prescriptions on file for pt. Advised them that rx was sent 2 days ago and receipt was confirmed by pharmacy. They stated they still do not have any prescriptions on file for pt, confirmed her name and DOB. Will send rx again.

## 2020-10-24 NOTE — Telephone Encounter (Signed)
Called pharmacy again. Provided them with copay card and pt allergies. They will need to process rx and will call pt then [place] back in a few days to coordinate shipment.

## 2020-10-26 NOTE — Telephone Encounter (Signed)
Called pharmacy for an update. Still being worked on by Pharmacist, community. They will need to contact pt for new pt referral as well, then med will be shipped to our office.

## 2020-11-03 NOTE — Telephone Encounter (Signed)
[place] calling in to get an update on Prior Authorization for leqvio '284mg'$ .. cover my meds key ([id number])... please advise

## 2020-11-07 NOTE — Telephone Encounter (Signed)
Prior [name] was approved last month. Insurance advised me they changed dispensing pharmacy to [facility] when I called them 2 weeks ago. Called insurance back, they did confirm that PA was approved on 10/18/20 through 10/18/21.  Called [facility] to see why they cannot fill rx. Provided them with PA approval # as well as direct line as they apparently need to confirm the PA approval as well. They will reach out to office/pt when this has been done. Pt is aware.

## 2020-11-07 NOTE — Telephone Encounter (Addendum)
Apparently [facility] is out of network and cannot fill Leqvio. They stated [facility] would need to. I advised them that [facility] cannot order Leqvio as I have already tried this. Will need to call insurance back again.  Called insurance - [facility] or [facility] are in network. Advised them that neither of these pharmacies can obtain Leqvio. They stated [facility] is in network, this pharmacy is listed on [employer]' website as being able to order Leqvio. Insurance will change dispensing pharmacy for the 4th time, now to [place]. New rx sent in, will call in pt insurance info and copay card info. 3rd authorization # is [id number].

## 2020-11-08 MED ORDER — LEQVIO 284 MG/1.5ML ~~LOC~~ SOSY: PREFILLED_SYRINGE | SUBCUTANEOUS | 3 refills | Status: DC

## 2020-11-08 NOTE — Addendum Note (Signed)
Addended by: Alaiyah Bollman E on: 11/08/2020 10:44 AM   Modules accepted: Orders

## 2020-11-09 NOTE — Telephone Encounter (Signed)
[place] and spoke with Merrily Pew, Careers adviser who deals with Leqvio. Provided pt insurance info and copay card, as well as insurance PA line and approval # as they will need to complete their own benefit investigation first. Then they will reach out to pt to confirm med and call office to schedule shipment.

## 2020-11-11 ENCOUNTER — Telehealth: Payer: Self-pay | Admitting: Interventional Cardiology

## 2020-11-11 DIAGNOSIS — E785 Hyperlipidemia, unspecified: Secondary | ICD-10-CM

## 2020-11-11 NOTE — Telephone Encounter (Signed)
Pt c/o medication issue:  1. Name of Medication:  inclisiran (LEQVIO) 284 MG/1.5ML SOSY injection  2. How are you currently taking this medication (dosage and times per day)? N/A  3. Are you having a reaction (difficulty breathing--STAT)? N/A  4. What is your medication issue? [name] is calling stating this medication is not covered by insurance and advised the office reach out to the insurance company for them to advise on alternatives. Please advise.

## 2020-11-14 NOTE — Telephone Encounter (Signed)
Received a call back from [place]. She states when she tries to process Leqvio, it states prior authorization is needed. I asked her if her team reached out to pt's insurance since I already provided them with the PA approval # as well as direct line to insurance. She stated another team takes care of that. I asked why this was not done since I already provided all this information to her manager last week. She states the manager is off today, then asked again for the insurance # and PA approval #. This has again been provided. She states she will call back with an update.

## 2020-11-14 NOTE — Telephone Encounter (Signed)
Looks like this needs a pharmd review

## 2020-11-14 NOTE — Telephone Encounter (Signed)
I have spent over 10 hours trying to obtain Leqvio for pt. Have had issues with her insurance and specialty pharmacy. She has had a prior authorization approved since last month, the pharmacy should be able to fill rx. [facility] pharmacy to see why they're having an issue processing [place] rx.  Called pharmacy 3x and was transferred to dead line each time. Then was advised I need extension 1566 for [place]. Had to leave a message.

## 2020-11-21 NOTE — Telephone Encounter (Addendum)
[facility] again. They stated [name] is being processed now and is ready to ship. Was advised that I would be receiving a call immediately to schedule this. Did not receive a call in the next 2 hours. Called back again to try to schedule med delivery again. Transferred to [employer], Careers adviser. Direct # to correct dept with [place] is is [phone]. He stated they need to confirm with pt that she is agreeable to having med sent to pharmacy, then they again will call back to office to schedule shipment. Left message for pt providing her with this update.

## 2020-11-22 ENCOUNTER — Telehealth: Payer: Self-pay | Admitting: Interventional Cardiology

## 2020-11-22 NOTE — Telephone Encounter (Signed)
Pt c/o medication issue:  1. Name of Medication:  inclisiran (LEQVIO) 284 MG/1.5ML SOSY injection  2. How are you currently taking this medication (dosage and times per day)? N/A  3. Are you having a reaction (difficulty breathing--STAT)? N/A  4. What is your medication issue? Calling stating prescription is needing to be submitted to insurance company so they can receive override to fill the medication.

## 2020-11-22 NOTE — Telephone Encounter (Signed)
Pt is not having Leqvio filled at [facility]. They stated they cannot order this medication. She is having it filled at [facility]. They are already processing rx.

## 2020-11-30 NOTE — Telephone Encounter (Signed)
[place] again as rx was sent over 3 weeks ago and still has not been processed/shipped to our office.   They stated they spoke with Jenny Reichmann with [employer] yesterday because they do not have medical billing info for the copay card. He stated it would have to go through the portal, but the portal states "this group is not allowed to submit claims through it to bill" so [employer] is looking into this since the pharmacy currently has no way of billing the copay card. Copay without the card is > $770 and cost prohibitive. Left message for pt advising her this is still in process.

## 2020-12-13 NOTE — Telephone Encounter (Addendum)
Received message from [address], they stated that they still can't run [place] card with pt's medical benefits and they don't have an estimate on when that would be allowed. Stated we could either try buy and bill (our office is not doing this) or try getting PA approved through her prescription instead of medical benefits. Will try submitting a PA.

## 2020-12-13 NOTE — Telephone Encounter (Signed)
[name] back as rx was now sent 5 weeks ago and med still hasn't been shipped. Initially advised me stated they shipped med (we have not received anything), then was advised they are having upper management look into it and will call back in an hour with an update.

## 2020-12-19 NOTE — Telephone Encounter (Addendum)
Leqvio prior authorization denied through pharmacy benefits, specialty pharmacy unable to fill rx since authorization was approved only under medical benefits. Called back to insurance [phone] to see if they could tell me Leqvio copay if pt gets med at infusion center. Specialty pharmacy had stated copay was > $700 even though prior benefit determination stated med would be covered at 100%.  Most recent authorization #[id number]. I was transferred to benefits line. They stated Leqvio will be covered at 100% with no deductible if it's given at [facility] infusion center.  Called pt to schedule appt. She wishes to have baseline lipids rechecked first, will complete labs this Friday. Scheduled first Leqvio injection for the following Friday 9/30.

## 2020-12-20 ENCOUNTER — Other Ambulatory Visit: Payer: Self-pay | Admitting: Pharmacist

## 2020-12-20 DIAGNOSIS — I2510 Atherosclerotic heart disease of native coronary artery without angina pectoris: Secondary | ICD-10-CM

## 2020-12-20 DIAGNOSIS — E785 Hyperlipidemia, unspecified: Secondary | ICD-10-CM

## 2020-12-21 NOTE — Addendum Note (Signed)
Addended by: Khrystal Jeanmarie E on: 12/21/2020 09:31 AM   Modules accepted: Orders

## 2020-12-23 ENCOUNTER — Other Ambulatory Visit: Payer: 59 | Admitting: *Deleted

## 2020-12-23 ENCOUNTER — Other Ambulatory Visit: Payer: Self-pay

## 2020-12-23 DIAGNOSIS — E785 Hyperlipidemia, unspecified: Secondary | ICD-10-CM

## 2020-12-23 LAB — LIPID PANEL
Chol/HDL Ratio: 2.9 ratio (ref 0.0–4.4)
Cholesterol, Total: 177 mg/dL (ref 100–199)
HDL: 62 mg/dL (ref >39)
LDL Chol Calc (NIH): 105 mg/dL — ABNORMAL HIGH (ref 0–99)
Triglycerides: 53 mg/dL (ref 0–149)
VLDL Cholesterol Cal: 10 mg/dL (ref 5–40)

## 2020-12-30 ENCOUNTER — Ambulatory Visit (HOSPITAL_COMMUNITY)
Admission: RE | Admit: 2020-12-30 | Discharge: 2020-12-30 | Disposition: A | Discharge status: Home / Self Care | Payer: 59 | Source: Ambulatory Visit | Attending: Interventional Cardiology | Admitting: Interventional Cardiology

## 2020-12-30 DIAGNOSIS — I2510 Atherosclerotic heart disease of native coronary artery without angina pectoris: Secondary | ICD-10-CM | POA: Insufficient documentation

## 2020-12-30 DIAGNOSIS — E785 Hyperlipidemia, unspecified: Secondary | ICD-10-CM | POA: Insufficient documentation

## 2020-12-30 MED ORDER — INCLISIRAN SODIUM 284 MG/1.5ML ~~LOC~~ SOSY
284.0000 mg | PREFILLED_SYRINGE | Freq: Once | SUBCUTANEOUS | Status: AC
  Administered 2020-12-30: 284 mg via SUBCUTANEOUS
  Filled 2020-12-30: qty 1.5

## 2021-01-30 ENCOUNTER — Other Ambulatory Visit: Payer: Self-pay

## 2021-01-30 DIAGNOSIS — E785 Hyperlipidemia, unspecified: Secondary | ICD-10-CM

## 2021-02-06 ENCOUNTER — Telehealth: Payer: Self-pay

## 2021-02-06 NOTE — Telephone Encounter (Signed)
Called and spoke to a representative with [facility] and they stated that [facility] was the service provider for the leqvio. They caouldn't change it so I had to do a complete new auth with [facility] as the service provider instead. I will route this to megan supple to make her aware.

## 2021-02-15 NOTE — Telephone Encounter (Signed)
Insurance now denying Leqvio prior authorization per message they left me when I was off because they didn't hear back the same day to specify whether pt could get Leqvio injection from [facility]. I returned call to [facility] with [facility], [phone] extension [phone] and left a very detailed message explaining that I had already faxed over months worth of chart notes and they already had all of this information available. [place] had the Leqvio rx for over 6 weeks and was unable to fill it. I also had faxed over chart notes detailing my prior conversation with [facility] who advised that Leqvio would be covered at 100% through [facility] which is where pt received her injection. Asked that she approve case rather than making [place] go through an appeals on a medication that has literally had 4 prior approvals but that insurance has made [place] complete an entirely new prior authorization just to change the servicing provider. Will await call back.

## 2021-02-22 NOTE — Telephone Encounter (Signed)
Spoke with Jenn with [facility], she states this case has been escalated up to the Garment/textile technologist. They are working with [facility] to make sure that pt's future injections can come from them. In the mean time, they are making an allowance for her to get her injections at [facility]. Provided them with service date of her last injection on 9/30 and was advised that her insurance will cover this injection. Danise Mina will let me know when the authorization is approved so that I can let our billing team know to re-bill [facility]. She will also let me know if pt is able to keep her next injection date of 12/23 at [facility] or if that injection will  need to come from [facility]. I asked if she had any info on whether or not [facility] is now able to use the copay card as that was the only issue with her not being able to fill there before. She is not sure but will reach out regarding that as well.

## 2021-03-03 ENCOUNTER — Telehealth: Payer: Self-pay | Admitting: Pharmacist

## 2021-03-03 NOTE — Telephone Encounter (Signed)
Pt called and left message stating she tolerated first Leqvio injection well and is asking if she needs to have next rx filled at [facility] who has reached out to her.  I left a message for [facility] to see if they are letting her get her 2nd scheduled injection at [facility] or if they're mandating she go back to fill at [facility]. [facility] advised me that they were in touch with [facility] who confirmed they could fill the rx. When I called [facility], they stated the case was closed and that they would need to reverify pt's benefits which was already done when they initially tried to fill the rx a few months ago. The tech I spoke with was unable to confirm whether or not the copay card would work, which was the whole issue in the first place. Provided them with my direct # to follow up with an update.  Pt is aware of ongoing insurance issues.

## 2021-03-13 NOTE — Telephone Encounter (Signed)
[facility] since they did not call back with an update. They stated that [facility] told them pt would be getting her December injection at [facility] but that [facility] will start filling next June 2023. I again asked to confirm her copay because the whole issue with her filling at [facility] in the first place is that the copay was hundreds of dollars and that her insurance is now mandating she fill there. [facility] cannot tell me this and stated they will need to verify her benefits next June which is essentially starting the whole process over that was already done earlier this year.  Called [employer] to confirm with them firsthand they will cover pt's December Leqvio injection through buy and bill at [facility] since I also never heard back from them after I left a message 10 days ago on 12/2. I have left another message for [facility] with [facility]. Pt's next Leqvio injection is currently scheduled for 12/23 at [facility].

## 2021-03-14 NOTE — Telephone Encounter (Signed)
Left message for pt.  Received fax from [facility] that pt's next Leqvio injection will be covered at [facility] infusion center. Coverage date through 03/24/21 which is the date of her injection. Left message for pt that she can keep her 12/23 injection date at [facility] but that if she needed to reschedule, she would need to reschedule for an earlier date, not a later date. Subsequent rx will need to come from [place] per [facility].

## 2021-03-17 ENCOUNTER — Encounter (HOSPITAL_BASED_OUTPATIENT_CLINIC_OR_DEPARTMENT_OTHER): Payer: Self-pay

## 2021-03-17 ENCOUNTER — Emergency Department (HOSPITAL_BASED_OUTPATIENT_CLINIC_OR_DEPARTMENT_OTHER)
Admission: EM | Admit: 2021-03-17 | Discharge: 2021-03-18 | Disposition: A | Discharge status: Home / Self Care | Payer: 59 | Attending: Emergency Medicine | Admitting: Emergency Medicine

## 2021-03-17 ENCOUNTER — Other Ambulatory Visit: Payer: Self-pay

## 2021-03-17 DIAGNOSIS — Z79899 Other long term (current) drug therapy: Secondary | ICD-10-CM | POA: Diagnosis not present

## 2021-03-17 DIAGNOSIS — M791 Myalgia, unspecified site: Secondary | ICD-10-CM | POA: Diagnosis not present

## 2021-03-17 DIAGNOSIS — Z7982 Long term (current) use of aspirin: Secondary | ICD-10-CM | POA: Insufficient documentation

## 2021-03-17 DIAGNOSIS — I2510 Atherosclerotic heart disease of native coronary artery without angina pectoris: Secondary | ICD-10-CM | POA: Insufficient documentation

## 2021-03-17 DIAGNOSIS — I10 Essential (primary) hypertension: Secondary | ICD-10-CM | POA: Insufficient documentation

## 2021-03-17 DIAGNOSIS — M549 Dorsalgia, unspecified: Secondary | ICD-10-CM | POA: Diagnosis present

## 2021-03-17 MED ORDER — MELOXICAM 7.5 MG PO TABS
7.5000 mg | ORAL_TABLET | Freq: Once | ORAL | Status: AC
  Administered 2021-03-18: 7.5 mg via ORAL
  Filled 2021-03-17: qty 1

## 2021-03-17 MED ORDER — MELOXICAM 7.5 MG PO TABS
7.5000 mg | ORAL_TABLET | Freq: Every day | ORAL | 0 refills | Status: AC
Start: 2021-03-17 — End: 2021-03-27

## 2021-03-17 NOTE — ED Triage Notes (Addendum)
Pt reports a deep pain in lower back/ left hip area radiating to left ankle. Seeing PCP and had flexeril prescribed but has been unable to get rx. Recently finished taking some prednisone. Pain treated with Advil.

## 2021-03-17 NOTE — Discharge Instructions (Signed)
It is difficult to tell if your pain is related to sciatica or muscular as there are components of both. Please utilize antiinflammatories, stretching, massage, heat and lidocaine patches (if they help) along with your muscle relaxers. If you aren't starting to have relief in next 2-3 days please follow up with your PCP or the sports medicine doctor listed in this paperwork.

## 2021-03-18 NOTE — ED Provider Notes (Signed)
[facility] EMERGENCY DEPT Provider Note   CSN: [id number] Arrival date & time: 03/17/21  2245     History Chief Complaint  Patient presents with   Back Pain    [name] is a 55 y.o. female.  55 year old female who presents to the emerged from today with left gluteal pain.  She states that she has had it for approximately a week.  She is seeing her doctor for.  Is worse with movement.  Gets little better with anti-inflammatories and certain positions.  She states that she tried a prednisone taper which did not help much.  She states that the ibuprofen and Tylenol helped temporarily.  She has tried heat.  No massage.  No trauma.  No fevers.  No history of cancer.  No neurologic changes.   Back Pain     Past Medical History:  Diagnosis Date   Coronary artery disease cardiologist-  dr Daneen Schick   09-26-2010  cardiac cath w/ PCI and DES x2 to LAD   Family history of early CAD    Fatty liver    Hyperlipidemia    Hypertension    PMB (postmenopausal bleeding)    S/P drug eluting coronary stent placement 09/26/2010   x2 to LAD   Uterine leiomyoma    Wears glasses     Patient Active Problem List   Diagnosis Date Noted   Systolic murmur 01/16/5101   Hyperlipidemia 07/13/2014   Essential hypertension, benign 02/04/2013   Fibroids    CAD (coronary artery disease)    Post-operative state 01/16/2013   Sebaceous cyst 12/08/2012    Past Surgical History:  Procedure Laterality Date   BREAST BIOPSY Right 12/30/2012   Procedure: CYST EXCISION BREAST;  Surgeon: Marcello Moores A. Cornett, MD;  Location: [facility];  Service: General;  Laterality: Right;   COLONOSCOPY WITH PROPOFOL  2016   CORONARY ANGIOPLASTY WITH STENT PLACEMENT  09-26-2010   dr Daneen Schick   unstable angina w/ exertion & abnormal ETT-- PCI and DES to midLAD and distalLAD (x2 stents),  normal LVF, ef 70%   DILATATION & CURETTAGE/HYSTEROSCOPY WITH MYOSURE N/A 09/26/2017   Procedure:  [place];  Surgeon: Waymon Amato, MD;  Location: [facility];  Service: Gynecology;  Laterality: N/A;   DILATATION & CURETTAGE/HYSTEROSCOPY WITH MYOSURE N/A 02/11/2020   Procedure: DILATATION & CURETTAGE/HYSTEROSCOPY WITH MYOSURE;  Surgeon: Waymon Amato, MD;  Location: [facility];  Service: Gynecology;  Laterality: N/A;     OB History     Gravida  3   Para  2   Term  2   Preterm      AB  1   Living  2      SAB  1   IAB      Ectopic      Multiple      Live Births              Family History  Problem Relation Age of Onset   Heart disease Father    Ovarian cancer Mother 26       ovarian sarcoma   Hypertension Brother    Hypertension Sister     Social History   Tobacco Use   Smoking status: Never   Smokeless tobacco: Never  Vaping Use   Vaping Use: Never used  Substance Use Topics   Alcohol use: No   Drug use: No    Home Medications Prior to Admission medications   Medication  Sig Start Date End Date Taking? Authorizing Provider  meloxicam (MOBIC) 7.5 MG tablet Take 1-2 tablets (7.5-15 mg total) by mouth daily for 10 days. 03/17/21 03/27/21 Yes Desirae Mancusi, Corene Cornea, MD  aspirin 81 MG tablet Take 81 mg by mouth daily.    [provider]  ELDERBERRY PO Take by mouth daily.    [provider]  ibuprofen (ADVIL) 800 MG tablet Take 1 tablet (800 mg total) by mouth every 8 (eight) hours as needed for moderate pain or cramping. 02/11/20   Waymon Amato, MD  inclisiran (LEQVIO) 284 MG/1.5ML SOSY injection Inject 284mg  subcutaneously at baseline, month 3, then every 6 months 11/08/20   Belva Crome, MD  lisinopril (ZESTRIL) 2.5 MG tablet Take 2.5 mg by mouth daily.    [provider]  Multiple Vitamin (MULTIVITAMIN) capsule Take 1 capsule by mouth daily.    [provider]  nitroGLYCERIN (NITROSTAT) 0.4 MG SL tablet Place 1 tablet (0.4 mg total) under the tongue every 5  (five) minutes as needed for chest pain. 06/16/13   Belva Crome, MD  Omega-3 Fatty Acids (FISH OIL) 1000 MG CAPS Take by mouth daily.    [provider]    Allergies    Erythromycin, Crestor [rosuvastatin], Lipitor [atorvastatin], Nexletol [bempedoic acid], Praluent [alirocumab], Pravastatin, Repatha [evolocumab], Welchol [colesevelam], Zetia [ezetimibe], and Livalo [pitavastatin]  Review of Systems   Review of Systems  Musculoskeletal:  Positive for back pain.  All other systems reviewed and are negative.  Physical Exam Updated Vital Signs BP (!) 170/90 (BP Location: Right Arm)    Pulse 68    Temp 97.7 F (36.5 C)    Resp 18    Ht 5\' 5"  (1.651 m)    Wt 79.8 kg    LMP 09/21/2013    SpO2 100%    BMI 29.29 kg/m   Physical Exam Vitals and nursing note reviewed.  Constitutional:      Appearance: She is well-developed.  HENT:     Head: Normocephalic and atraumatic.     Mouth/Throat:     Mouth: Mucous membranes are moist.     Pharynx: Oropharynx is clear.  Eyes:     Pupils: Pupils are equal, round, and reactive to light.  Cardiovascular:     Rate and Rhythm: Normal rate and regular rhythm.  Pulmonary:     Effort: No respiratory distress.     Breath sounds: No stridor.  Abdominal:     General: Abdomen is flat. There is no distension.  Musculoskeletal:        General: Tenderness (left gluteal) present. No swelling. Normal range of motion.     Cervical back: Normal range of motion.  Skin:    General: Skin is warm and dry.  Neurological:     General: No focal deficit present.     Mental Status: She is alert.     Sensory: No sensory deficit.     Motor: No weakness.     Gait: Gait abnormal (antalgic).     Comments: No significant neurologic deficit associated with the left leg pain.    ED Results / Procedures / Treatments   Labs (all labs ordered are listed, but only abnormal results are displayed) Labs Reviewed - No data to display  EKG None  Radiology No  results found.  Procedures Procedures   Medications Ordered in ED Medications  meloxicam (MOBIC) tablet 7.5 mg (7.5 mg Oral Given 03/18/21 0004)    ED Course  I have reviewed the  triage vital signs and the nursing notes.  Pertinent labs & imaging results that were available during my care of the patient were reviewed by me and considered in my medical decision making (see chart for details).    MDM Rules/Calculators/A&P                         Could be sciatic since the pain does radiate down her leg but it sounds more muscular and the fact that he gets better anti-inflammatories, certain positions and worse with certain other positions.  Already seeing her primary doctor for it.  She would like to see sports medicine doctor if possible.  She wanted some type of imaging to make sure nothing more serious was going on however I did stated that MRI would be the only reasonable option and we do not have that option here.  Low suspicion for DVT without swelling, redness or palpable cord.  Also no risk factors besides recent COVID.  Suggested Mobic, heat, massage and if not get better in 3 to 4 days following up with sports medicine    Final Clinical Impression(s) / ED Diagnoses Final diagnoses:  Muscle pain    Rx / DC Orders ED Discharge Orders          Ordered    meloxicam (MOBIC) 7.5 MG tablet  Daily        03/17/21 2357             Naila Elizondo, Corene Cornea, MD 03/18/21 0036

## 2021-03-24 ENCOUNTER — Encounter (HOSPITAL_COMMUNITY)
Admission: RE | Admit: 2021-03-24 | Discharge: 2021-03-24 | Disposition: A | Discharge status: Home / Self Care | Payer: 59 | Source: Ambulatory Visit | Attending: Interventional Cardiology | Admitting: Interventional Cardiology

## 2021-03-24 DIAGNOSIS — E785 Hyperlipidemia, unspecified: Secondary | ICD-10-CM | POA: Insufficient documentation

## 2021-03-24 MED ORDER — INCLISIRAN SODIUM 284 MG/1.5ML ~~LOC~~ SOSY
284.0000 mg | PREFILLED_SYRINGE | Freq: Once | SUBCUTANEOUS | Status: AC
  Administered 2021-03-24: 11:00:00 284 mg via SUBCUTANEOUS

## 2021-03-24 MED ORDER — INCLISIRAN SODIUM 284 MG/1.5ML ~~LOC~~ SOSY
PREFILLED_SYRINGE | SUBCUTANEOUS | Status: AC
  Filled 2021-03-24: qty 1.5

## 2021-03-28 NOTE — Telephone Encounter (Signed)
[facility] called stating in May they will be able to dispense Leqvio for patient. They calling to ask who will giving the pt the injection. The infusion center or the Dr. Gabriel Carina. I advised that as long as they can provide the medication to the patient we can administer in the office. I asked about copay card and they stated she will be able to use, but they will have to confirm benefits and everything again in May.

## 2021-04-11 ENCOUNTER — Other Ambulatory Visit: Payer: Self-pay | Admitting: Sports Medicine

## 2021-04-11 ENCOUNTER — Ambulatory Visit
Admission: RE | Admit: 2021-04-11 | Discharge: 2021-04-11 | Disposition: A | Discharge status: Home / Self Care | Payer: 59 | Source: Ambulatory Visit | Attending: Sports Medicine | Admitting: Sports Medicine

## 2021-04-11 DIAGNOSIS — M25552 Pain in left hip: Secondary | ICD-10-CM

## 2021-04-21 ENCOUNTER — Telehealth: Payer: Self-pay | Admitting: Pharmacist

## 2021-04-21 ENCOUNTER — Other Ambulatory Visit: Payer: Self-pay

## 2021-04-21 ENCOUNTER — Other Ambulatory Visit: Payer: 59 | Admitting: *Deleted

## 2021-04-21 DIAGNOSIS — M25552 Pain in left hip: Secondary | ICD-10-CM

## 2021-04-21 DIAGNOSIS — M25551 Pain in right hip: Secondary | ICD-10-CM

## 2021-04-21 DIAGNOSIS — E785 Hyperlipidemia, unspecified: Secondary | ICD-10-CM

## 2021-04-21 NOTE — Telephone Encounter (Signed)
Patient called just wanting to let us know that she is having joints/ hips pain. Pain came out of no where. Starts in her hip and goes to her ankle. Also having pain in her arms. Not sure if its from her Leqvio. She is seeing sports medicine. She been doing stretches but pain is still pretty bad. Sports medicine is doing a workup to try to figure out where the pain is coming from.  CK ordered to make sure we are not dealing with muscle breakdown. She will try to go to [facility] tomorrow to get it drawn.

## 2021-04-22 LAB — CK: Total CK: 184 U/L — ABNORMAL HIGH (ref 32–182)

## 2021-04-25 NOTE — Telephone Encounter (Signed)
CK mildly elevated at 184. Spoke with pt. Started having muscle pain, then that resolved but she developed joint pain from her hips to her ankles. Seeing sports med and had an x ray, no issues noted. She has gotten 2 Leqvio injections so far (Sept and Dec 2022), issues started after her 1st injection and became worse after her 2nd injection. Pain has been ongoing since then.  Rechecking lipids and LFTs on Friday per pt request. She will touch base closer to her next pending injection date in June to let me know if she wishes to proceed with next scheduled injection. She is intolerant to all other lipid lowering options including multiple statins (pravastatin, rosuvastatin, atorvastatin, pitavastatin), Zetia, Praluent, Repatha, Nexletol, and Welchol.

## 2021-04-28 ENCOUNTER — Other Ambulatory Visit: Payer: Self-pay

## 2021-04-28 ENCOUNTER — Other Ambulatory Visit: Payer: 59 | Admitting: *Deleted

## 2021-04-28 DIAGNOSIS — E785 Hyperlipidemia, unspecified: Secondary | ICD-10-CM

## 2021-04-29 LAB — HEPATIC FUNCTION PANEL
ALT: 35 IU/L — ABNORMAL HIGH (ref 0–32)
AST: 60 IU/L — ABNORMAL HIGH (ref 0–40)
Albumin: 4.8 g/dL (ref 3.8–4.9)
Alkaline Phosphatase: 83 IU/L (ref 44–121)
Bilirubin Total: 0.5 mg/dL (ref 0.0–1.2)
Bilirubin, Direct: 0.14 mg/dL (ref 0.00–0.40)
Total Protein: 7.3 g/dL (ref 6.0–8.5)

## 2021-04-29 LAB — LIPID PANEL
Chol/HDL Ratio: 2.3 ratio (ref 0.0–4.4)
Cholesterol, Total: 155 mg/dL (ref 100–199)
HDL: 68 mg/dL (ref >39)
LDL Chol Calc (NIH): 74 mg/dL (ref 0–99)
Triglycerides: 63 mg/dL (ref 0–149)
VLDL Cholesterol Cal: 13 mg/dL (ref 5–40)

## 2021-05-05 NOTE — Telephone Encounter (Signed)
Patient called back to speak with [facility]. She states after talking with her sport medicine doctor she thinks she is going to continue with the injections. Michela Pitcher they are working on things and its getting better. As long as Dr. Tamala Julian is ok with her levels she is ok continuing. She did request Jinny Blossom give her a call back.

## 2021-05-08 NOTE — Telephone Encounter (Signed)
Spoke with pt. Sports MD thinks she's having muscular not joint pain, going to continue PT and plans to continue with next Leqvio injection. Asks about LFTs which were mildly elevated. She has been using a lot of Tylenol for her pain so this is likely contributing. Discussed labs didn't show a clinically significant elevation.

## 2021-06-26 ENCOUNTER — Encounter (HOSPITAL_COMMUNITY): Payer: 59

## 2021-08-09 ENCOUNTER — Other Ambulatory Visit: Payer: Self-pay | Admitting: Pharmacist

## 2021-08-09 MED ORDER — LEQVIO 284 MG/1.5ML ~~LOC~~ SOSY: PREFILLED_SYRINGE | SUBCUTANEOUS | 1 refills | Status: DC

## 2021-08-16 ENCOUNTER — Telehealth: Payer: Self-pay

## 2021-08-16 NOTE — Telephone Encounter (Signed)
Called and spoke to rep at the [facility] and they stated that they needed to complete a pa an dthey do it on their end that last note was made on 07/19/21. I requested that they expedite the process considering that last notation that they needed a pa in their system was 07/20/19 and almost a full month ago. I will route to Dr. Jinny Blossom supple rph to make her aware ?

## 2021-08-16 NOTE — Telephone Encounter (Signed)
-----   Message from Leeroy Bock, [place] sent at 08/16/2021  7:43 AM EDT ----- ?Are you able to call specialty pharmacy to see if they're processing pt's Leqvio and shipping it to our office soon? Thank you! ? ?

## 2021-08-16 NOTE — Telephone Encounter (Signed)
We already have prior authorization on file approved through 10/18/21, unclear what separate PA the pharmacy would need to complete. Will reach out again in another week. ?

## 2021-08-21 NOTE — Telephone Encounter (Signed)
Called and spoke to a representaive was transferred multiple times. They stated that they are awaiting the pt to call them back. Also I noticed that this pt has it scheduled at the injection clinic... I thought if its shipped to the patients home meaning they would do the injection with you. I will route ths back to megan supple

## 2021-08-21 NOTE — Telephone Encounter (Signed)
[facility]ed [facility] but they must not open until 9 am will try back later

## 2021-08-21 NOTE — Telephone Encounter (Signed)
Are they waiting for the pt to call them back about coordinating shipment or is there anything else going on with the rx? Pt received first 2 Leqvio injections at short stay because [facility] couldn't figure out how to process the rx, insurance mandates that future fills come from [facility] unfortunately. Rx will be shipped to office and provider will give injection to pt.

## 2021-08-21 NOTE — Telephone Encounter (Signed)
They stated that they needed the pt to call them bac to coordinate shipment however they asked me if they had an appointment and I stated the appt date and time in the med injection room at the infusion clinic because I had no idea how the [facility] work. Routing back to megan supple as requested.

## 2021-08-21 NOTE — Telephone Encounter (Signed)
They stated that they will send it out 1st week of June. Routing to megan supple to make her aware.

## 2021-08-21 NOTE — Telephone Encounter (Signed)
Called pt, states she has not received any calls from [facility]. Provided her with their # to reach out to.

## 2021-09-01 ENCOUNTER — Ambulatory Visit
Admission: RE | Admit: 2021-09-01 | Discharge: 2021-09-01 | Disposition: A | Discharge status: Home / Self Care | Payer: 59 | Source: Ambulatory Visit | Attending: Sports Medicine | Admitting: Sports Medicine

## 2021-09-01 ENCOUNTER — Other Ambulatory Visit: Payer: Self-pay | Admitting: Sports Medicine

## 2021-09-01 DIAGNOSIS — M25512 Pain in left shoulder: Secondary | ICD-10-CM

## 2021-09-01 NOTE — Telephone Encounter (Signed)
Received Leqvio shipment in office. Canceled short stay appt and rescheduled at [facility] on 6/30.

## 2021-09-05 ENCOUNTER — Encounter (HOSPITAL_COMMUNITY): Payer: 59

## 2021-09-08 ENCOUNTER — Encounter (HOSPITAL_COMMUNITY): Payer: 59

## 2021-09-29 ENCOUNTER — Ambulatory Visit: Payer: 59 | Admitting: Pharmacist

## 2021-09-29 DIAGNOSIS — E785 Hyperlipidemia, unspecified: Secondary | ICD-10-CM

## 2021-09-29 DIAGNOSIS — I2510 Atherosclerotic heart disease of native coronary artery without angina pectoris: Secondary | ICD-10-CM

## 2021-09-29 MED ORDER — INCLISIRAN SODIUM 284 MG/1.5ML ~~LOC~~ SOSY
284.0000 mg | PREFILLED_SYRINGE | Freq: Once | SUBCUTANEOUS | Status: AC
  Administered 2021-09-29: 284 mg via SUBCUTANEOUS

## 2021-09-29 NOTE — Progress Notes (Unsigned)
Patient ID: [name]                 DOB: 1965/12/03                    MRN: [id number]     HPI: [name] is a 56 y.o. female patient referred to lipid clinic by Dr. Tamala Julian. PMH is significant for  CAD, LAD DES 2 in 2012, hypertension, and hyperlipidemia. She has had significant troubles tolerating lipid medications. Started on Leqvio in  September 2022.   She comes in today for her 3rd injection of Leqvio. States she is back to working out. Still doing PT on her shoulder. Trying to work on her blood sugar control with diet and exercise.  Current Medications: Leqvio q 6 months Intolerances: atorvastatin, rosuvastatin '5mg'$  daily, pravastatin '10mg'$  every other day, pitavastatin 0.'5mg'$  every other day (elevated CK), Zetia '10mg'$ , Praluent, Repatha, and Nexletol - myalgias with all.  Risk Factors: premature disease (PCI s/p DES x2 in 2012)  LDL goal: <55  Exercise: walking, running, HITT, core strength   Diet: protein and vegetables Oatmeal, boiled egg Chicken [place], fish   Family History:  Family History  Problem Relation Age of Onset   Heart disease Father    Ovarian cancer Mother 89       ovarian sarcoma   Hypertension Brother    Hypertension Sister     Social History:  Social History   Socioeconomic History   Marital status: Divorced    Spouse name: Not on file   Number of children: Not on file   Years of education: Not on file   Highest education level: Not on file  Occupational History   Not on file  Tobacco Use   Smoking status: Never   Smokeless tobacco: Never  Vaping Use   Vaping Use: Never used  Substance and Sexual Activity   Alcohol use: No   Drug use: No   Sexual activity: Yes    Birth control/protection: Other-see comments    Comment: PARTNER HAD VAS.  Other Topics Concern   Not on file  Social History Narrative   Not on file   Social Determinants of Health   Financial Resource Strain: Not on file  Food Insecurity: Not on file   Transportation Needs: Not on file  Physical Activity: Not on file  Stress: Not on file  Social Connections: Not on file  Intimate Partner Violence: Not on file     Labs:04/28/21 TC 155 TG 63 HDL 68 LDL-C 74 (Leqvio '284mg'$  q 6 months)  Past Medical History:  Diagnosis Date   Coronary artery disease cardiologist-  dr Daneen Schick   09-26-2010  cardiac cath w/ PCI and DES x2 to LAD   Family history of early CAD    Fatty liver    Hyperlipidemia    Hypertension    PMB (postmenopausal bleeding)    S/P drug eluting coronary stent placement 09/26/2010   x2 to LAD   Uterine leiomyoma    Wears glasses     Current Outpatient Medications on File Prior to Visit  Medication Sig Dispense Refill   aspirin 81 MG tablet Take 81 mg by mouth daily.     ELDERBERRY PO Take by mouth daily.     ibuprofen (ADVIL) 800 MG tablet Take 1 tablet (800 mg total) by mouth every 8 (eight) hours as needed for moderate pain or cramping. 30 tablet 0   inclisiran (LEQVIO) 284 MG/1.5ML SOSY injection  Inject '284mg'$  subcutaneously every 6 months. 1.5 mL 1   lisinopril (ZESTRIL) 2.5 MG tablet Take 2.5 mg by mouth daily.     Multiple Vitamin (MULTIVITAMIN) capsule Take 1 capsule by mouth daily.     nitroGLYCERIN (NITROSTAT) 0.4 MG SL tablet Place 1 tablet (0.4 mg total) under the tongue every 5 (five) minutes as needed for chest pain. 25 tablet 3   Omega-3 Fatty Acids (FISH OIL) 1000 MG CAPS Take by mouth daily.     No current facility-administered medications on file prior to visit.    Allergies  Allergen Reactions   Erythromycin Other (See Comments)    Stomach cramps   Crestor [Rosuvastatin]     Severe muscle aches on Crestor 5 mg once daily   Lipitor [Atorvastatin] Other (See Comments)    Aches in muscles/barely can move   Nexletol [Bempedoic Acid]     myalgias   Praluent [Alirocumab]     myalgias   Pravastatin     Myalgias on '10mg'$  every other day   Repatha [Evolocumab]     myalgias   Welchol  [Colesevelam]     GI upset   Zetia [Ezetimibe]     myalgias   Livalo [Pitavastatin] Other (See Comments)    Increased CK    Assessment/Plan:  1. Hyperlipidemia - Ieqvio '284mg'$ /1.16m injected into patients left upper arm.   Thank you,   MRamond Dial Pharm.D, BCPS, CPP [place] [id number] [place] [id number] Phone: [phone] Fax: [phone]

## 2021-10-06 ENCOUNTER — Ambulatory Visit: Payer: 59 | Admitting: Interventional Cardiology

## 2021-10-18 ENCOUNTER — Ambulatory Visit: Payer: Self-pay

## 2021-10-18 ENCOUNTER — Other Ambulatory Visit: Payer: Self-pay | Admitting: Family Medicine

## 2021-10-18 DIAGNOSIS — M25512 Pain in left shoulder: Secondary | ICD-10-CM

## 2021-10-19 NOTE — Progress Notes (Signed)
Cardiology Office Note:    Date:  10/20/2021   ID:  [name], DOB [birth year], MRN [id number]  PCP:  Wenda Low, MD  Cardiologist:  Sinclair Grooms, MD   Referring MD: Wenda Low, MD   Chief Complaint  Patient presents with   Coronary Artery Disease   Hypertension   Hyperlipidemia   Advice Only    Prediabetes.    History of Present Illness:    [name] is a 56 y.o. female with a hx of  CAD, LAD DES 2 in 2012, hypertension, and hyperlipidemia (Leqvio)..   No cardiac complaints.  Left rotator cuff injury.  Denies chest pain.  No side effects on Leqvio.  Exercising greater than 150 minutes/week.  Discussed her 3 silent killers: Prediabetes, hypertension, hyperlipidemia and the importance of treating numbers to target to avoid future events.  Past Medical History:  Diagnosis Date   Coronary artery disease cardiologist-  dr Daneen Schick   09-26-2010  cardiac cath w/ PCI and DES x2 to LAD   Family history of early CAD    Fatty liver    Hyperlipidemia    Hypertension    PMB (postmenopausal bleeding)    S/P drug eluting coronary stent placement 09/26/2010   x2 to LAD   Uterine leiomyoma    Wears glasses     Past Surgical History:  Procedure Laterality Date   BREAST BIOPSY Right 12/30/2012   Procedure: CYST EXCISION BREAST;  Surgeon: Marcello Moores A. Cornett, MD;  Location: [facility];  Service: General;  Laterality: Right;   COLONOSCOPY WITH PROPOFOL  2016   CORONARY ANGIOPLASTY WITH STENT PLACEMENT  09-26-2010   dr Daneen Schick   unstable angina w/ exertion & abnormal ETT-- PCI and DES to midLAD and distalLAD (x2 stents),  normal LVF, ef 70%   DILATATION & CURETTAGE/HYSTEROSCOPY WITH MYOSURE N/A 09/26/2017   Procedure: [place];  Surgeon: Waymon Amato, MD;  Location: [facility];  Service: Gynecology;  Laterality: N/A;   DILATATION & CURETTAGE/HYSTEROSCOPY WITH MYOSURE N/A 02/11/2020    Procedure: DILATATION & CURETTAGE/HYSTEROSCOPY WITH MYOSURE;  Surgeon: Waymon Amato, MD;  Location: [facility];  Service: Gynecology;  Laterality: N/A;    Current Medications: Current Meds  Medication Sig   aspirin 81 MG tablet Take 81 mg by mouth daily.   ELDERBERRY PO Take by mouth daily.   ibuprofen (ADVIL) 800 MG tablet Take 1 tablet (800 mg total) by mouth every 8 (eight) hours as needed for moderate pain or cramping.   inclisiran (LEQVIO) 284 MG/1.5ML SOSY injection Inject '284mg'$  subcutaneously every 6 months.   lisinopril (ZESTRIL) 2.5 MG tablet Take 2.5 mg by mouth daily.   Multiple Vitamin (MULTIVITAMIN) capsule Take 1 capsule by mouth daily.   nitroGLYCERIN (NITROSTAT) 0.4 MG SL tablet Place 1 tablet (0.4 mg total) under the tongue every 5 (five) minutes as needed for chest pain.   Omega-3 Fatty Acids (FISH OIL) 1000 MG CAPS Take by mouth daily.     Allergies:   Erythromycin, Crestor [rosuvastatin], Lipitor [atorvastatin], Nexletol [bempedoic acid], Praluent [alirocumab], Pravastatin, Repatha [evolocumab], Welchol [colesevelam], Zetia [ezetimibe], and Livalo [pitavastatin]   Social History   Socioeconomic History   Marital status: Divorced    Spouse name: Not on file   Number of children: Not on file   Years of education: Not on file   Highest education level: Not on file  Occupational History   Not on file  Tobacco Use  Smoking status: Never   Smokeless tobacco: Never  Vaping Use   Vaping Use: Never used  Substance and Sexual Activity   Alcohol use: No   Drug use: No   Sexual activity: Yes    Birth control/protection: Other-see comments    Comment: PARTNER HAD VAS.  Other Topics Concern   Not on file  Social History Narrative   Not on file   Social Determinants of Health   Financial Resource Strain: Not on file  Food Insecurity: Not on file  Transportation Needs: Not on file  Physical Activity: Not on file  Stress: Not on file  Social  Connections: Not on file     Family History: The patient's family history includes Heart disease in her father; Hypertension in her brother and sister; Ovarian cancer (age of onset: 23) in her mother.  ROS:   Please see the history of present illness.    No new data other than right shoulder discomfort all other systems reviewed and are negative.  EKGs/Labs/Other Studies Reviewed:    The following studies were reviewed today: 2D Doppler echocardiogram 03/13/2019: IMPRESSIONS     1. Left ventricular ejection fraction, by visual estimation, is 60 to  65%. The left ventricle has normal function. There is no left ventricular  hypertrophy.   2. The left ventricle has no regional wall motion abnormalities.   3. Global right ventricle has normal systolic function.The right  ventricular size is normal. No increase in right ventricular wall  thickness.   4. Left atrial size was normal.   5. Right atrial size was normal.   6. The mitral valve is normal in structure. No evidence of mitral valve  regurgitation. No evidence of mitral stenosis.   7. The tricuspid valve is normal in structure. Tricuspid valve  regurgitation is not demonstrated.   8. The aortic valve is normal in structure. Aortic valve regurgitation is  not visualized. No evidence of aortic valve sclerosis or stenosis.   9. The pulmonic valve was normal in structure. Pulmonic valve  regurgitation is not visualized.  10. Mildly elevated pulmonary artery systolic pressure.  11. The inferior vena cava is normal in size with greater than 50%  respiratory variability, suggesting right atrial pressure of 3 mmHg.   EKG:  EKG reveals normal sinus rhythm with normal EKG appearance.  No change when compared to 02/16/2020.  Recent Labs: 04/28/2021: ALT 35  Recent Lipid Panel    Component Value Date/Time   CHOL 155 04/28/2021 1259   TRIG 63 04/28/2021 1259   HDL 68 04/28/2021 1259   CHOLHDL 2.3 04/28/2021 1259   CHOLHDL 2.1  10/31/2015 0800   VLDL 13 10/31/2015 0800   LDLCALC 74 04/28/2021 1259    Physical Exam:    VS:  BP 138/80   Pulse 63   Ht '5\' 5"'$  (1.651 m)   Wt 175 lb 9.6 oz (79.7 kg)   LMP 09/21/2013   SpO2 99%   BMI 29.22 kg/m     Wt Readings from Last 3 Encounters:  10/20/21 175 lb 9.6 oz (79.7 kg)  03/17/21 176 lb (79.8 kg)  03/31/20 179 lb (81.2 kg)     GEN: Healthy appearing. No acute distress HEENT: Normal NECK: No JVD. LYMPHATICS: No lymphadenopathy CARDIAC: No murmur. RRR no gallop, or edema. VASCULAR:  Normal Pulses. No bruits. RESPIRATORY:  Clear to auscultation without rales, wheezing or rhonchi  ABDOMEN: Soft, non-tender, non-distended, No pulsatile mass, MUSCULOSKELETAL: No deformity  SKIN: Warm and dry NEUROLOGIC:  Alert and oriented x 3 PSYCHIATRIC:  Normal affect   ASSESSMENT:    1. Coronary artery disease involving native coronary artery of native heart without angina pectoris   2. Hyperlipidemia, unspecified hyperlipidemia type   3. Essential hypertension, benign   4. Prediabetes    PLAN:    In order of problems listed above:  Secondary prevention reviewed.  She is well versed and is sitting all targets especially her exercise metrics. Continue Leqvio Blood pressure target 130/80 mmHg.  Discussed in detail.  Monitoring to ensure control stressed. Hemoglobin A1c 6.1.  Physical activity and low carbohydrate diet discussed.  Overall education and awareness concerning primary/secondary risk prevention was discussed in detail: LDL less than 70, hemoglobin A1c less than 7, blood pressure target less than 130/80 mmHg, >150 minutes of moderate aerobic activity per week, avoidance of smoking, weight control (via diet and exercise), and continued surveillance/management of/for obstructive sleep apnea.    Medication Adjustments/Labs and Tests Ordered: Current medicines are reviewed at length with the patient today.  Concerns regarding medicines are outlined above.   Orders Placed This Encounter  Procedures   EKG 12-Lead   No orders of the defined types were placed in this encounter.   Patient Instructions  Medication Instructions:  Your physician recommends that you continue on your current medications as directed. Please refer to the Current Medication list given to you today.  *If you need a refill on your cardiac medications before your next appointment, please call your pharmacy*  Lab Work: NONE  Testing/Procedures: NONE  Follow-Up: At [facility], you and your health needs are our priority.  As part of our continuing mission to provide you with exceptional heart care, we have created designated Provider Care Teams.  These Care Teams include your primary Cardiologist (physician) and Advanced Practice Providers (APPs -  Physician Assistants and Nurse Practitioners) who all work together to provide you with the care you need, when you need it.  Your next appointment:   1 year(s)  The format for your next appointment:   In Person  Provider:   Larae Grooms, MD  Important Information About Sugar         Signed, Sinclair Grooms, MD  10/20/2021 9:34 AM    [place]

## 2021-10-20 ENCOUNTER — Ambulatory Visit: Payer: 59 | Admitting: Interventional Cardiology

## 2021-10-20 ENCOUNTER — Encounter: Payer: Self-pay | Admitting: Interventional Cardiology

## 2021-10-20 VITALS — BP 138/80 | HR 63 | Ht 65.0 in | Wt 175.6 lb

## 2021-10-20 DIAGNOSIS — R7303 Prediabetes: Secondary | ICD-10-CM | POA: Diagnosis not present

## 2021-10-20 DIAGNOSIS — I2510 Atherosclerotic heart disease of native coronary artery without angina pectoris: Secondary | ICD-10-CM

## 2021-10-20 DIAGNOSIS — I10 Essential (primary) hypertension: Secondary | ICD-10-CM | POA: Diagnosis not present

## 2021-10-20 DIAGNOSIS — E785 Hyperlipidemia, unspecified: Secondary | ICD-10-CM | POA: Diagnosis not present

## 2021-10-20 NOTE — Patient Instructions (Signed)
Medication Instructions:  Your physician recommends that you continue on your current medications as directed. Please refer to the Current Medication list given to you today.  *If you need a refill on your cardiac medications before your next appointment, please call your pharmacy*  Lab Work: NONE  Testing/Procedures: NONE  Follow-Up: At [facility], you and your health needs are our priority.  As part of our continuing mission to provide you with exceptional heart care, we have created designated Provider Care Teams.  These Care Teams include your primary Cardiologist (physician) and Advanced Practice Providers (APPs -  Physician Assistants and Nurse Practitioners) who all work together to provide you with the care you need, when you need it.  Your next appointment:   1 year(s)  The format for your next appointment:   In Person  Provider:   Larae Grooms, MD  Important Information About Sugar

## 2021-11-01 ENCOUNTER — Other Ambulatory Visit: Payer: Self-pay | Admitting: Sports Medicine

## 2021-11-01 DIAGNOSIS — M25512 Pain in left shoulder: Secondary | ICD-10-CM

## 2021-11-07 ENCOUNTER — Ambulatory Visit
Admission: RE | Admit: 2021-11-07 | Discharge: 2021-11-07 | Disposition: A | Discharge status: Home / Self Care | Payer: 59 | Source: Ambulatory Visit | Attending: Sports Medicine | Admitting: Sports Medicine

## 2021-11-07 DIAGNOSIS — M25512 Pain in left shoulder: Secondary | ICD-10-CM

## 2021-11-10 ENCOUNTER — Telehealth: Payer: Self-pay | Admitting: Interventional Cardiology

## 2021-11-10 NOTE — Telephone Encounter (Signed)
Pt c/o medication issue:  1. Name of Medication:   inclisiran (LEQVIO) 284 MG/1.5ML SOSY injection  2. How are you currently taking this medication (dosage and times per day)?   3. Are you having a reaction (difficulty breathing--STAT)?   4. What is your medication issue?   Caller provided the key code to complete the prior authorization.  Key Code # [id number]

## 2021-11-10 NOTE — Telephone Encounter (Signed)
Leqvio does need updated prior authorization. This has been submitted on cover my meds using key provided below by specialty pharmacy.

## 2021-11-13 NOTE — Telephone Encounter (Signed)
Received response that prior authorization is not needed and that Leqvio is on pt's formulary. Have faxed this message to [place]. Pt not due ofr next injection until December.

## 2021-11-14 ENCOUNTER — Telehealth: Payer: Self-pay | Admitting: Interventional Cardiology

## 2021-11-14 NOTE — Telephone Encounter (Signed)
Pt c/o medication issue:  1. Name of Medication:   inclisiran (LEQVIO) 284 MG/1.5ML SOSY injection  2. How are you currently taking this medication (dosage and times per day)? Patient taking as prescribed  3. Are you having a reaction (difficulty breathing--STAT)? N/A  4. What is your medication issue?   Caller stated the insurance company has declined to cover this medication.  Caller stated an appeal for this medication can be filed to [phone], [employer] Department.  Caller requested a copy of the patient's chart note be faxed to them so they could apply for medication through the patient's medical benefits.  Fax# [phone].

## 2021-11-14 NOTE — Telephone Encounter (Signed)
There is no denial for this medication from our office. I already faxed letter from insurance to the pharmacy stating that medication is available on formulary based on the response I got when submitting prior authorization using key [id number] in [facility] that the specialty pharmacy initiated. Will fax this over again.

## 2021-11-17 ENCOUNTER — Telehealth: Payer: Self-pay

## 2021-11-17 NOTE — Telephone Encounter (Signed)
   Name: ESMEE FALLAW  DOB: 01/16/1966  MRN: [id number]   Primary Cardiologist: Sinclair Grooms, MD  Chart reviewed as part of pre-operative protocol coverage. Patient was contacted 11/17/2021 in reference to pre-operative risk assessment for pending surgery as outlined below.  Kora Groom Rodriques was last seen on 10/20/2021 by Dr. Tamala Julian.  Since that day, JAMAR CASAGRANDE has done well from a cardiac standpoint. She denies any new symptoms or concerns. She is able to complete > 4 METS without difficulty.  Therefore, based on ACC/AHA guidelines, the patient would be at acceptable risk for the planned procedure without further cardiovascular testing.   The patient was advised that if she develops new symptoms prior to surgery to contact our office to arrange for a follow-up visit, and she verbalized understanding.  Regarding ASA therapy, we recommend continuation of ASA throughout the perioperative period.  However, if the surgeon feels that cessation of ASA is required in the perioperative period, it may be stopped 5-7 days prior to surgery with a plan to resume it as soon as felt to be feasible from a surgical standpoint in the post-operative period.  I will route this recommendation to the requesting party via Epic fax function and remove from pre-op pool. Please call with questions.  Lenna Sciara, NP 11/17/2021, 3:15 PM

## 2021-11-17 NOTE — Telephone Encounter (Signed)
   Pre-operative Risk Assessment    Patient Name: [name]  DOB: [birth year] MRN: [id number]      Request for Surgical Clearance    Procedure:  Left shoulder scope, superior capsular reconstruction rotator cuff repair   Date of Surgery:  Clearance 11/27/21                                 Surgeon:  Dr. Ophelia Charter Surgeon's Group or Practice Name:  [facility]  Phone number:  [phone] Fax number:  [phone]   Type of Clearance Requested:   - Medical    Type of Anesthesia:  General    Additional requests/questions:    SignedMendel Ryder   11/17/2021, 2:43 PM

## 2021-11-30 ENCOUNTER — Telehealth: Payer: Self-pay | Admitting: Interventional Cardiology

## 2021-11-30 NOTE — Telephone Encounter (Signed)
Pt c/o medication issue:  1. Name of Medication: inclisiran (LEQVIO) 284 MG/1.5ML SOSY injection  2. How are you currently taking this medication (dosage and times per day)? Inject every 6 month  3. Are you having a reaction (difficulty breathing--STAT)? no  4. What is your medication issue? Sonia Side with [employer] calling to request the patient's last office notes for a prior auth for the medication. Fax: [phone] Attention Leonie Douglas. Phone: [phone]

## 2021-11-30 NOTE — Telephone Encounter (Signed)
Chart notes faxed to [phone]

## 2022-03-15 ENCOUNTER — Telehealth: Payer: Self-pay | Admitting: Pharmacist

## 2022-03-15 NOTE — Telephone Encounter (Signed)
Patient called to see when her next Leqvio injection is due. She left VM Called pt. Left detailed VM per DPR. She is scheduled for next injection 1/12 '@8'$ :30. Her last injection was 6/30. Advised patient call back if unable to make this appointment.

## 2022-04-03 ENCOUNTER — Other Ambulatory Visit: Payer: Self-pay | Admitting: Pharmacist

## 2022-04-03 MED ORDER — LEQVIO 284 MG/1.5ML ~~LOC~~ SOSY: PREFILLED_SYRINGE | SUBCUTANEOUS | 1 refills | Status: DC

## 2022-04-10 ENCOUNTER — Telehealth: Payer: Self-pay | Admitting: Pharmacist

## 2022-04-10 NOTE — Telephone Encounter (Signed)
[name] got pt's Leqvio prior [place] approved. They left message for pt, need verbal consent before they ship med to our office. I left message for pt to have her call them back as well at [phone]. She is currently scheduled for Leqvio injection on 1/12.

## 2022-04-11 NOTE — Telephone Encounter (Signed)
Received call from [employer], will receive Leqvio shipped to office tomorrow.

## 2022-04-12 NOTE — Telephone Encounter (Signed)
Medication has been received. Placed in locked sample drawer.

## 2022-04-13 ENCOUNTER — Ambulatory Visit: Payer: 59

## 2022-04-13 ENCOUNTER — Ambulatory Visit: Payer: 59 | Attending: Interventional Cardiology | Admitting: Pharmacist

## 2022-04-13 DIAGNOSIS — I2510 Atherosclerotic heart disease of native coronary artery without angina pectoris: Secondary | ICD-10-CM | POA: Diagnosis not present

## 2022-04-13 DIAGNOSIS — R7303 Prediabetes: Secondary | ICD-10-CM

## 2022-04-13 DIAGNOSIS — E782 Mixed hyperlipidemia: Secondary | ICD-10-CM

## 2022-04-13 NOTE — Progress Notes (Unsigned)
Patient ID: [name]                 DOB: 1965-12-19                    MRN: [id number]     HPI: [name] is a 57 y.o. female patient referred to lipid clinic by Dr Tamala Julian. PMH is significant for CAD s/p DES x2 to LAD in 2012, HTN, HLD, prediabetes, and FHx premature CAD. I have followed pt for the past few years for her cholesterol given significant intolerances to lipid lowering therapies (4 statins, both PCSK9i, Zetia, and Nexletol). She started on Leqvio injections 12/2020 and has been tolerating well since then.  Pt presents for Q6M Leqvio injection today. Tolerating medication well.  Current Medications: Leqvio '284mg'$  Q6M  Intolerances:  Atorvastatin - myalgias  Rosuvastatin '5mg'$  daily - myalgias  Pravastatin '10mg'$  every other day - myalgias  Pitavastatin 0.'5mg'$  every other day - elevated CK Zetia '10mg'$  - myalgias  Praluent - myalgias  Repatha - myalgias  Nexletol - myalgias  Welchol - ineffective  Risk Factors: premature CAD  LDL goal: '55mg'$ /dL  Family History: Heart disease in her father; Hypertension in her brother and sister; Ovarian cancer (age of onset: 26) in her mother.   Social History: No tobacco, alcohol or drug use.  Labs: 04/28/21: TC 155, TG 63, HDL 68, LDL 74 (Leqvio '284mg'$  Q6M) 10/05/20: TC 198, TG 64, HDL 64, LDL 122  Past Medical History:  Diagnosis Date   Coronary artery disease cardiologist-  dr Daneen Schick   09-26-2010  cardiac cath w/ PCI and DES x2 to LAD   Family history of early CAD    Fatty liver    Hyperlipidemia    Hypertension    PMB (postmenopausal bleeding)    S/P drug eluting coronary stent placement 09/26/2010   x2 to LAD   Uterine leiomyoma    Wears glasses     Current Outpatient Medications on File Prior to Visit  Medication Sig Dispense Refill   aspirin 81 MG tablet Take 81 mg by mouth daily.     ELDERBERRY PO Take by mouth daily.     ibuprofen (ADVIL) 800 MG tablet Take 1 tablet (800 mg total) by mouth every 8 (eight)  hours as needed for moderate pain or cramping. 30 tablet 0   inclisiran (LEQVIO) 284 MG/1.5ML SOSY injection Inject '284mg'$  subcutaneously every 6 months. 1.5 mL 1   lisinopril (ZESTRIL) 2.5 MG tablet Take 2.5 mg by mouth daily.     Multiple Vitamin (MULTIVITAMIN) capsule Take 1 capsule by mouth daily.     nitroGLYCERIN (NITROSTAT) 0.4 MG SL tablet Place 1 tablet (0.4 mg total) under the tongue every 5 (five) minutes as needed for chest pain. 25 tablet 3   Omega-3 Fatty Acids (FISH OIL) 1000 MG CAPS Take by mouth daily.     No current facility-administered medications on file prior to visit.    Allergies  Allergen Reactions   Erythromycin Other (See Comments)    Stomach cramps   Crestor [Rosuvastatin]     Severe muscle aches on Crestor 5 mg once daily   Lipitor [Atorvastatin] Other (See Comments)    Aches in muscles/barely can move   Nexletol [Bempedoic Acid]     myalgias   Praluent [Alirocumab]     myalgias   Pravastatin     Myalgias on '10mg'$  every other day   Repatha [Evolocumab]     myalgias  Welchol [Colesevelam]     GI upset   Zetia [Ezetimibe]     myalgias   Livalo [Pitavastatin] Other (See Comments)    Increased CK    Assessment/Plan:  1. Hyperlipidemia - LDL goal < 55 given premature ASCVD. Pt intolerant to 4 statins, ezetimibe, Nexletol, Praluent, Repatha, and Welchol. Has been on Leqvio since 12/2020 and tolerating well. Leqvio '284mg'$  injected SQ into upper right arm today. Will recheck fasting labs next week and adding on A1c per pt request. Will be due for next Leqvio injection in another 6 months.  Shantrice Rodenberg E. Yahye Siebert, PharmD, BCACP, [facility]. [address], [place], [place] [address] Phone: [phone]; Fax: [phone] 04/13/2022 8:09 AM

## 2022-04-17 ENCOUNTER — Ambulatory Visit: Payer: 59 | Attending: Interventional Cardiology

## 2022-04-17 DIAGNOSIS — R7303 Prediabetes: Secondary | ICD-10-CM

## 2022-04-17 DIAGNOSIS — I2510 Atherosclerotic heart disease of native coronary artery without angina pectoris: Secondary | ICD-10-CM

## 2022-04-18 LAB — LIPID PANEL
Chol/HDL Ratio: 2.3 ratio (ref 0.0–4.4)
Cholesterol, Total: 166 mg/dL (ref 100–199)
HDL: 71 mg/dL (ref >39)
LDL Chol Calc (NIH): 85 mg/dL (ref 0–99)
Triglycerides: 50 mg/dL (ref 0–149)
VLDL Cholesterol Cal: 10 mg/dL (ref 5–40)

## 2022-04-18 LAB — HEMOGLOBIN A1C
Est. average glucose Bld gHb Est-mCnc: 128 mg/dL
Hgb A1c MFr Bld: 6.1 % — ABNORMAL HIGH (ref 4.8–5.6)

## 2022-04-18 LAB — ALT: ALT: 27 IU/L (ref 0–32)

## 2022-09-12 ENCOUNTER — Other Ambulatory Visit: Payer: Self-pay | Admitting: Pharmacist

## 2022-09-12 MED ORDER — LEQVIO 284 MG/1.5ML ~~LOC~~ SOSY: PREFILLED_SYRINGE | SUBCUTANEOUS | 1 refills | Status: DC

## 2022-10-01 ENCOUNTER — Telehealth: Payer: Self-pay | Admitting: Pharmacist

## 2022-10-01 NOTE — Telephone Encounter (Signed)
[facility] pharmacy because I have not heard any update on them sending [place] rx despite med being refilled 2 weeks ago.  They stated they need to verify her benefits for the month of July, then call pt to confirm, then call us to schedule delivery. Provided them with my # so they can follow up.

## 2022-10-08 NOTE — Telephone Encounter (Addendum)
Never received a return call from [facility] pharmacy. Pt is due for Leqvio injection this week. Called them again. No one answered at the pharmacy and I was sent to voicemail. I have left a voicemail.

## 2022-10-08 NOTE — Telephone Encounter (Signed)
[facility] called back, they got consent from pt to ship out med today. They will ship out med to be delivered on Wednesday. Verified address office.

## 2022-10-12 ENCOUNTER — Encounter: Payer: Self-pay | Admitting: Pharmacist

## 2022-10-12 ENCOUNTER — Ambulatory Visit: Payer: 59 | Attending: Internal Medicine | Admitting: Pharmacist

## 2022-10-12 DIAGNOSIS — I2510 Atherosclerotic heart disease of native coronary artery without angina pectoris: Secondary | ICD-10-CM

## 2022-10-12 DIAGNOSIS — E782 Mixed hyperlipidemia: Secondary | ICD-10-CM

## 2022-10-12 NOTE — Progress Notes (Signed)
Patient ID: [name]                 DOB: 1965-07-29                    MRN: [id number]     HPI: [name] is a 57 y.o. female patient referred to lipid clinic by Dr Katrinka Blazing. PMH is significant for CAD s/p DES x2 to LAD in 2012, HTN, HLD, prediabetes, and FHx premature CAD. I have followed pt for the past few years for her cholesterol given significant intolerances to lipid lowering therapies (4 statins, both PCSK9i, Zetia, and Nexletol). She started on Leqvio injections 12/2020 and has been tolerating well since then.  Pt presents for Q6M Leqvio injection today. Tolerating medication well.  Current Medications: Leqvio 284mg  Q6M  Intolerances:  Atorvastatin - myalgias  Rosuvastatin 5mg  daily - myalgias  Pravastatin 10mg  every other day - myalgias  Pitavastatin 0.5mg  every other day - elevated CK Zetia 10mg  - myalgias  Praluent - myalgias  Repatha - myalgias  Nexletol - myalgias  Welchol - ineffective  Risk Factors: premature CAD  LDL goal: 55mg /dL  Family History: Heart disease in her father; Hypertension in her brother and sister; Ovarian cancer (age of onset: 21) in her mother.   Social History: No tobacco, alcohol or drug use.  Labs: 04/28/21: TC 155, TG 63, HDL 68, LDL 74 (Leqvio 284mg  Q6M) 10/05/20: TC 198, TG 64, HDL 64, LDL 122  Past Medical History:  Diagnosis Date   Coronary artery disease cardiologist-  dr Verdis Prime   09-26-2010  cardiac cath w/ PCI and DES x2 to LAD   Family history of early CAD    Fatty liver    Hyperlipidemia    Hypertension    PMB (postmenopausal bleeding)    S/P drug eluting coronary stent placement 09/26/2010   x2 to LAD   Uterine leiomyoma    Wears glasses     Current Outpatient Medications on File Prior to Visit  Medication Sig Dispense Refill   aspirin 81 MG tablet Take 81 mg by mouth daily.     ELDERBERRY PO Take by mouth daily.     ibuprofen (ADVIL) 800 MG tablet Take 1 tablet (800 mg total) by mouth every 8 (eight)  hours as needed for moderate pain or cramping. 30 tablet 0   inclisiran (LEQVIO) 284 MG/1.5ML SOSY injection Inject 284mg  subcutaneously every 6 months. 1.5 mL 1   lisinopril (ZESTRIL) 2.5 MG tablet Take 2.5 mg by mouth daily.     Multiple Vitamin (MULTIVITAMIN) capsule Take 1 capsule by mouth daily.     nitroGLYCERIN (NITROSTAT) 0.4 MG SL tablet Place 1 tablet (0.4 mg total) under the tongue every 5 (five) minutes as needed for chest pain. 25 tablet 3   Omega-3 Fatty Acids (FISH OIL) 1000 MG CAPS Take by mouth daily.     No current facility-administered medications on file prior to visit.    Allergies  Allergen Reactions   Erythromycin Other (See Comments)    Stomach cramps   Crestor [Rosuvastatin]     Severe muscle aches on Crestor 5 mg once daily   Lipitor [Atorvastatin] Other (See Comments)    Aches in muscles/barely can move   Nexletol [Bempedoic Acid]     myalgias   Praluent [Alirocumab]     myalgias   Pravastatin     Myalgias on 10mg  every other day   Repatha [Evolocumab]     myalgias  Welchol [Colesevelam]     GI upset   Zetia [Ezetimibe]     myalgias   Livalo [Pitavastatin] Other (See Comments)    Increased CK    Assessment/Plan:  1. Hyperlipidemia - LDL 85 on Leqvio, above goal < 55 given premature ASCVD. Pt intolerant to 4 statins, ezetimibe, Nexletol, Praluent, Repatha, and Welchol. Do not have other options to add on to lower LDL further given prior intolerances. Has been on Leqvio since 12/2020 and tolerating well. Leqvio 284mg  injected SQ into upper right arm today. Will be due for next Leqvio injection in another 6 months in January 2025.  Tatyanna Cronk E. Torsha Lemus, PharmD, BCACP, CPP [facility] [address], [place], [place] [address] Phone: [phone]; Fax: [phone] 10/12/2022 7:20 AM

## 2023-01-24 ENCOUNTER — Telehealth: Payer: Self-pay | Admitting: Cardiology

## 2023-01-24 NOTE — Telephone Encounter (Signed)
Pharmacy called to say they are no longer severing our patients. Please advise

## 2023-02-05 ENCOUNTER — Ambulatory Visit: Payer: 59 | Admitting: Interventional Cardiology

## 2023-02-21 DIAGNOSIS — R739 Hyperglycemia, unspecified: Secondary | ICD-10-CM | POA: Insufficient documentation

## 2023-02-21 NOTE — Progress Notes (Deleted)
  Cardiology Office Note:   Date:  02/21/2023  ID:  [name], DOB [birth year], MRN [id number] PCP: Georgann Housekeeper, MD  [facility] Providers Cardiologist:  Lesleigh Noe, MD (Inactive) {  History of Present Illness:   [name] is a 57 y.o. female previously seen by Dr. Katrinka Blazing.  She has CAD with previous LAD stenting in 2012.Marland Kitchen  She is also managed for HTN and dyslipidemia:  ***     . Hemodynamic data:     a.     Aortic pressure 102/60 mmHg.     b.     Left ventricular pressure 102/7 mmHg. 2. Left ventriculography:  LV is hyperdynamic.  EF is 70%. 3. Coronary angiography.     a.     Left main widely patent.     b.     Left anterior descending coronary:  The left anterior      descending coronary is dominant.  It wraps around the left      ventricular apex.  It gives origin to 3 diagonal branches.  First   and second diagonals are moderate in size.  The third diagonal is      relatively small.  There is an eccentric 70% stenosis followed by      50% stenosis in the mid LAD after the second diagonal.  There is      an 80-90% stenosis in the distal LAD before a large and long      apical segment after the third diagonal.  This distal lesion is      likely to represent the patient's culprit lesion.     c.     Circumflex artery:  Circumflex coronary artery is large and      trifurcates on the left lateral wall.     d.     Right coronary:  The right coronary artery is codominant and      was difficult to cannulate but is free of any significant      obstruction. 4. PCI:  The LAD mid disease is reduced from 70% to 0% with TIMI grade     3 flow and the distal LAD disease is reduced from 80-90% to 0% with     TIMI grade 3 flow after stenting.    ROS: ***  Studies Reviewed:    EKG:       ***  Risk Assessment/Calculations:   {Does this patient have ATRIAL FIBRILLATION?:[phone]} No BP recorded.  {Refresh Note OR Click here to enter BP  :1}***         Physical Exam:   VS:  LMP 09/21/2013    Wt Readings from Last 3 Encounters:  10/20/21 175 lb 9.6 oz (79.7 kg)  03/17/21 176 lb (79.8 kg)  03/31/20 179 lb (81.2 kg)     GEN: Well nourished, well developed in no acute distress NECK: No JVD; No carotid bruits CARDIAC: ***RR, *** murmurs, rubs, gallops RESPIRATORY:  Clear to auscultation without rales, wheezing or rhonchi  ABDOMEN: Soft, non-tender, non-distended EXTREMITIES:  No edema; No deformity   ASSESSMENT AND PLAN:   *** CAD:  ***  HTN:  ***  Dyslipidemia:  ***    Prediabetes:  ***    Follow up ***  Signed, Rollene Rotunda, MD

## 2023-02-22 ENCOUNTER — Ambulatory Visit: Payer: 59 | Admitting: Cardiology

## 2023-02-22 DIAGNOSIS — R739 Hyperglycemia, unspecified: Secondary | ICD-10-CM

## 2023-02-22 DIAGNOSIS — I2510 Atherosclerotic heart disease of native coronary artery without angina pectoris: Secondary | ICD-10-CM

## 2023-02-22 DIAGNOSIS — I10 Essential (primary) hypertension: Secondary | ICD-10-CM

## 2023-02-22 DIAGNOSIS — E785 Hyperlipidemia, unspecified: Secondary | ICD-10-CM

## 2023-04-12 ENCOUNTER — Encounter: Payer: Self-pay | Admitting: Cardiology

## 2023-04-12 ENCOUNTER — Ambulatory Visit: Payer: 59 | Attending: Cardiology | Admitting: Cardiology

## 2023-04-12 VITALS — BP 118/76 | HR 74 | Ht 65.0 in | Wt 177.6 lb

## 2023-04-12 DIAGNOSIS — I10 Essential (primary) hypertension: Secondary | ICD-10-CM | POA: Diagnosis not present

## 2023-04-12 DIAGNOSIS — I2510 Atherosclerotic heart disease of native coronary artery without angina pectoris: Secondary | ICD-10-CM

## 2023-04-12 NOTE — Patient Instructions (Signed)
 Medication Instructions:  No changes.  *If you need a refill on your cardiac medications before your next appointment, please call your pharmacy*   Lab Work: Fasting Lipid and Lpa. If you have labs (blood work) drawn today and your tests are completely normal, you will receive your results only by: MyChart Message (if you have MyChart) OR A paper copy in the mail If you have any lab test that is abnormal or we need to change your treatment, we will call you to review the results.   Follow-Up: At [facility], you and your health needs are our priority.  As part of our continuing mission to provide you with exceptional heart care, we have created designated Provider Care Teams.  These Care Teams include your primary Cardiologist (physician) and Advanced Practice Providers (APPs -  Physician Assistants and Nurse Practitioners) who all work together to provide you with the care you need, when you need it.  We recommend signing up for the patient portal called MyChart.  Sign up information is provided on this After Visit Summary.  MyChart is used to connect with patients for Virtual Visits (Telemedicine).  Patients are able to view lab/test results, encounter notes, upcoming appointments, etc.  Non-urgent messages can be sent to your provider as well.   To learn more about what you can do with MyChart, go to [email or web address].    Your next appointment:   1 year(s)  Provider:   Lynwood Schilling, MD

## 2023-04-12 NOTE — Progress Notes (Signed)
  Cardiology Office Note:   Date:  04/12/2023  ID:  [name], DOB 1966/01/08, MRN [id number] PCP: Ransom Other, MD  [facility] Providers Cardiologist:  Lynwood Schilling, MD {  History of Present Illness:   [name] is a 58 y.o. female who was previously seen by Dr. Claudene.  She has a history of coronary disease with stenting to the mid LAD and distal LAD in 2012.  She has had a normal echocardiogram with the last one being in 2020.    She has done well.  She is active exercising almost every day. The patient denies any new symptoms such as chest discomfort, neck or arm discomfort. There has been no new shortness of breath, PND or orthopnea. There have been no reported palpitations, presyncope or syncope.  She had none of the chest discomfort that was her previous angina.    She watches her diet.  Of note she has been intolerant of atorvastatin, rosuvastatin , pravastatin , atorvastatin, Zetia , Praluent, Repatha , Nexletol  and WelChol .  She tolerates current regimen.  ROS: As stated in the HPI and negative for all other systems.  Studies Reviewed:    EKG:   EKG Interpretation Date/Time:  Friday April 12 2023 11:53:14 EST Ventricular Rate:  67 PR Interval:  162 QRS Duration:  80 QT Interval:  408 QTC Calculation: 431 R Axis:   51  Text Interpretation: Normal sinus rhythm When compared with ECG of 12-May-2017 16:48, No significant change was found Confirmed by Schilling Lynwood ([id number]) on 04/12/2023 12:13:25 PM     Risk Assessment/Calculations:              Physical Exam:   VS:  BP 118/76   Pulse 74   Ht 5' 5 (1.651 m)   Wt 177 lb 9.6 oz (80.6 kg)   LMP 09/21/2013   SpO2 99%   BMI 29.55 kg/m    Wt Readings from Last 3 Encounters:  04/12/23 177 lb 9.6 oz (80.6 kg)  10/20/21 175 lb 9.6 oz (79.7 kg)  03/17/21 176 lb (79.8 kg)     GEN: Well nourished, well developed in no acute distress NECK: No JVD; No carotid bruits CARDIAC: RRR, no murmurs, rubs,  gallops RESPIRATORY:  Clear to auscultation without rales, wheezing or rhonchi  ABDOMEN: Soft, non-tender, non-distended EXTREMITIES:  No edema; No deformity   ASSESSMENT AND PLAN:   CAD:  The patient has no new sypmtoms.  No further cardiovascular testing is indicated.  We will continue with aggressive risk reduction and meds as listed.  DYSLIPIDEMIA: LDL was at target previously.  However, about a year and I will repeat a lipid profile and I will check an LP(a).  HTN: The blood pressure is well-controlled.  No change in therapy.  Follow up follow-up with me in 1 year.  Signed, Lynwood Schilling, MD

## 2023-04-22 ENCOUNTER — Encounter: Payer: Self-pay | Admitting: *Deleted

## 2023-04-23 LAB — LIPID PANEL
Chol/HDL Ratio: 2.6 {ratio} (ref 0.0–4.4)
Cholesterol, Total: 171 mg/dL (ref 100–199)
HDL: 65 mg/dL (ref >39)
LDL Chol Calc (NIH): 97 mg/dL (ref 0–99)
Triglycerides: 40 mg/dL (ref 0–149)
VLDL Cholesterol Cal: 9 mg/dL (ref 5–40)

## 2023-04-23 LAB — LIPOPROTEIN A (LPA): Lipoprotein (a): 40.7 nmol/L (ref <75.0)

## 2023-05-02 ENCOUNTER — Other Ambulatory Visit: Payer: Self-pay | Admitting: Pharmacist

## 2023-05-02 ENCOUNTER — Other Ambulatory Visit: Payer: Self-pay

## 2023-05-02 ENCOUNTER — Telehealth: Payer: Self-pay | Admitting: Pharmacist

## 2023-05-02 MED ORDER — LEQVIO 284 MG/1.5ML ~~LOC~~ SOSY
PREFILLED_SYRINGE | SUBCUTANEOUS | 1 refills | Status: AC
  Filled 2023-05-03: qty 1.5, fill #0

## 2023-05-02 NOTE — Telephone Encounter (Signed)
Patient due for Leqvio injection Can no longer fill with [facility] Will send to [facility] and see if we are able to fill there

## 2023-05-03 ENCOUNTER — Telehealth: Payer: Self-pay | Admitting: Pharmacy Technician

## 2023-05-03 ENCOUNTER — Other Ambulatory Visit: Payer: Self-pay

## 2023-05-03 NOTE — Telephone Encounter (Addendum)
[name],  Patient has been approved and will be scheduled as soon as possible.  Auth Submission: APPROVED Site of care: Site of care: [facility] Payer: [employer] Medication & CPT/J Code(s) submitted: Leqvio (Inclisiran) [id number] Route of submission (phone, fax, portal):  Phone # Fax # Auth type: Buy/Bill PB Units/visits requested: X2 DOSES Reference number: [id number] Approval from: 05/02/23 to 05/01/24    Co-pay card: approved

## 2023-05-14 NOTE — Telephone Encounter (Signed)
Per [employer] [facility], [name] is covered at 100% PA on file. Detailed message per DPR left on pt VM

## 2023-05-17 ENCOUNTER — Ambulatory Visit: Payer: 59

## 2023-05-17 VITALS — BP 153/88 | HR 61 | Temp 98.2°F | Resp 16 | Ht 65.0 in | Wt 179.4 lb

## 2023-05-17 DIAGNOSIS — I2510 Atherosclerotic heart disease of native coronary artery without angina pectoris: Secondary | ICD-10-CM

## 2023-05-17 DIAGNOSIS — E782 Mixed hyperlipidemia: Secondary | ICD-10-CM | POA: Diagnosis not present

## 2023-05-17 MED ORDER — INCLISIRAN SODIUM 284 MG/1.5ML ~~LOC~~ SOSY
284.0000 mg | PREFILLED_SYRINGE | Freq: Once | SUBCUTANEOUS | Status: AC
  Administered 2023-05-17: 284 mg via SUBCUTANEOUS
  Filled 2023-05-17: qty 1.5

## 2023-05-17 NOTE — Progress Notes (Signed)
Diagnosis: Hyperlipidemia  Provider:  Chilton Greathouse MD  Procedure: Injection  Leqvio (inclisiran), Dose: 284 mg, Site: subcutaneous, Number of injections: 1  Injection Site(s): Right arm  Post Care: Patient declined observation  Discharge: Condition: Good, Destination: Home . AVS Provided  Performed by:  Adriana Mccallum, RN

## 2023-05-17 NOTE — Patient Instructions (Signed)
Inclisiran Injection What is this medication? INCLISIRAN (in kli SIR an) treats high cholesterol. It works by decreasing bad cholesterol (such as LDL) in your blood. Changes to diet and exercise are often combined with this medication. This medicine may be used for other purposes; ask your health care provider or pharmacist if you have questions. COMMON BRAND NAME(S): LEQVIO What should I tell my care team before I take this medication? They need to know if you have any of these conditions: An unusual or allergic reaction to inclisiran, other medications, foods, dyes, or preservatives Pregnant or trying to get pregnant Breast-feeding How should I use this medication? This medication is injected under the skin. It is given by your care team in a hospital or clinic setting. Talk to your care team about the use of this medication in children. Special care may be needed. Overdosage: If you think you have taken too much of this medicine contact a poison control center or emergency room at once. NOTE: This medicine is only for you. Do not share this medicine with others. What if I miss a dose? Keep appointments for follow-up doses. It is important not to miss your dose. Call your care team if you are unable to keep an appointment. What may interact with this medication? Interactions are not expected. This list may not describe all possible interactions. Give your health care provider a list of all the medicines, herbs, non-prescription drugs, or dietary supplements you use. Also tell them if you smoke, drink alcohol, or use illegal drugs. Some items may interact with your medicine. What should I watch for while using this medication? Visit your care team for regular checks on your progress. Tell your care team if your symptoms do not start to get better or if they get worse. You may need blood work while you are taking this medication. What side effects may I notice from receiving this  medication? Side effects that you should report to your care team as soon as possible: Allergic reactions--skin rash, itching, hives, swelling of the face, lips, tongue, or throat Side effects that usually do not require medical attention (report these to your care team if they continue or are bothersome): Joint pain Pain, redness, or irritation at injection site This list may not describe all possible side effects. Call your doctor for medical advice about side effects. You may report side effects to FDA at 1-800-FDA-1088. Where should I keep my medication? This medication is given in a hospital or clinic. It will not be stored at home. NOTE: This sheet is a summary. It may not cover all possible information. If you have questions about this medicine, talk to your doctor, pharmacist, or health care provider.  2024 Elsevier/Gold Standard (2023-03-01 00:00:00)

## 2023-05-23 NOTE — Progress Notes (Signed)
 Cardiology Office Note    Date:  05/25/2023  ID:  [name], DOB 1966-03-17, MRN [id number] PCP:  Georgann Housekeeper, MD  Cardiologist:  Rollene Rotunda, MD  Electrophysiologist:  None   Chief Complaint: Palpitations   History of Present Illness: [name] is a 58 y.o. female with visit-pertinent history of CAD with stenting to the mid LAD and distal LAD in 2012, hypertension and hyperlipidemia with history of intolerance to atorvastatin, rosuvastatin, pravastatin, Zetia, Praluent, Repatha, Nexletol and WelChol.  Patient was previously followed by Dr. Katrinka Blazing.  She establish care with Dr. Antoine Poche on 04/12/2023.  She reported that she had been doing well at that time.  She was exercising almost daily.  She denied any new symptoms such as chest discomfort, shortness of breath, palpitations, orthopnea or PND.  Had 6 month injection on Friday, had headache injection. Then noted some palpitations, very sensitive to caffiene, doesn't believe she has had anything. Has stopped eating sugar. Has had some occassional palpitations before, tied to caffiene or sugar. Happening when she gets to work, occurring daily, lasting under five minutes. Fluttering feeling in her chest.   Today she presents regarding palpitations. She reports that she has been noting increased palpitations in the last few weeks.  They are occurring nearly daily, first episodes when she arrives at work lasting under 5 minutes.  Described as a fluttering sensation in her chest.  She denies any associated symptoms.  She notes that she is very sensitive to caffeine or sugar, but has cut this out of her diet.  She denies chest pain, shortness of breath, lower extremity edema, orthopnea or PND.  She notes that she recently had her Leqvio injection and has noted a mild headache as well as some bodyaches since injection, notes this is the first time this has occurred.  ROS: .   Today she denies chest pain, shortness of breath, lower  extremity edema, fatigue, melena, hematuria, hemoptysis, diaphoresis, weakness, presyncope, syncope, orthopnea, and PND.  All other systems are reviewed and otherwise negative. Studies Reviewed: Marland Kitchen    EKG:  EKG is ordered today, personally reviewed, demonstrating  EKG Interpretation Date/Time:  Friday May 24 2023 14:27:16 EST Ventricular Rate:  72 PR Interval:  144 QRS Duration:  82 QT Interval:  410 QTC Calculation: 448 R Axis:   35  Text Interpretation: Sinus rhythm with Premature atrial complexes When compared with ECG of 12-Apr-2023 11:53, Premature atrial complexes are now Present Confirmed by Reather Littler [phone]) on 05/24/2023 2:45:04 PM   CV Studies:  Cardiac Studies & Procedures   ______________________________________________________________________________________________     ECHOCARDIOGRAM  ECHOCARDIOGRAM COMPLETE 03/13/2019  Narrative ECHOCARDIOGRAM REPORT    Patient Name:   [name] Date of Exam: 03/13/2019 Medical Rec #:  [id number]      Height:       65.0 in Accession #:    [id number]     Weight:       176.1 lb Date of Birth:  February 05, 1966      BSA:          1.87 m Patient Age:    53 years       BP:           118/76 mmHg Patient Gender: F              HR:           61 bpm. Exam Location:  [address]  Procedure: 2D Echo, Cardiac  Doppler and Color Doppler  Indications:    R01.1 Murmur  History:        Patient has no prior history of Echocardiogram examinations. CAD; Risk Factors:Hypertension, Dyslipidemia and Family History of Coronary Artery Disease.  Sonographer:    Cathie Beams RCS Referring Phys: [phone] [facility]  IMPRESSIONS   1. Left ventricular ejection fraction, by visual estimation, is 60 to 65%. The left ventricle has normal function. There is no left ventricular hypertrophy. 2. The left ventricle has no regional wall motion abnormalities. 3. Global right ventricle has normal systolic function.The right ventricular size is  normal. No increase in right ventricular wall thickness. 4. Left atrial size was normal. 5. Right atrial size was normal. 6. The mitral valve is normal in structure. No evidence of mitral valve regurgitation. No evidence of mitral stenosis. 7. The tricuspid valve is normal in structure. Tricuspid valve regurgitation is not demonstrated. 8. The aortic valve is normal in structure. Aortic valve regurgitation is not visualized. No evidence of aortic valve sclerosis or stenosis. 9. The pulmonic valve was normal in structure. Pulmonic valve regurgitation is not visualized. 10. Mildly elevated pulmonary artery systolic pressure. 11. The inferior vena cava is normal in size with greater than 50% respiratory variability, suggesting right atrial pressure of 3 mmHg.  FINDINGS Left Ventricle: Left ventricular ejection fraction, by visual estimation, is 60 to 65%. The left ventricle has normal function. The left ventricle has no regional wall motion abnormalities. There is no left ventricular hypertrophy. Left ventricular diastolic parameters were normal. Normal left atrial pressure.  Right Ventricle: The right ventricular size is normal. No increase in right ventricular wall thickness. Global RV systolic function is has normal systolic function. The tricuspid regurgitant velocity is 2.70 m/s, and with an assumed right atrial pressure of 10 mmHg, the estimated right ventricular systolic pressure is mildly elevated at 39.2 mmHg.  Left Atrium: Left atrial size was normal in size.  Right Atrium: Right atrial size was normal in size  Pericardium: There is no evidence of pericardial effusion.  Mitral Valve: The mitral valve is normal in structure. No evidence of mitral valve regurgitation. No evidence of mitral valve stenosis by observation.  Tricuspid Valve: The tricuspid valve is normal in structure. Tricuspid valve regurgitation is not demonstrated.  Aortic Valve: The aortic valve is normal in  structure. Aortic valve regurgitation is not visualized. The aortic valve is structurally normal, with no evidence of sclerosis or stenosis.  Pulmonic Valve: The pulmonic valve was normal in structure. Pulmonic valve regurgitation is not visualized. Pulmonic regurgitation is not visualized.  Aorta: The aortic root, ascending aorta and aortic arch are all structurally normal, with no evidence of dilitation or obstruction.  Venous: The inferior vena cava is normal in size with greater than 50% respiratory variability, suggesting right atrial pressure of 3 mmHg.  IAS/Shunts: No atrial level shunt detected by color flow Doppler. There is no evidence of a patent foramen ovale. No ventricular septal defect is seen or detected. There is no evidence of an atrial septal defect.   LEFT VENTRICLE PLAX 2D LVIDd:         4.00 cm  Diastology LVIDs:         2.40 cm  LV e' lateral:   14.70 cm/s LV PW:         0.90 cm  LV E/e' lateral: 6.1 LV IVS:        1.00 cm  LV e' medial:    11.10 cm/s LVOT diam:  1.85 cm  LV E/e' medial:  8.1 LV SV:         50 ml LV SV Index:   25.72 LVOT Area:     2.69 cm   RIGHT VENTRICLE RV Basal diam:  2.10 cm RV S prime:     15.80 cm/s TAPSE (M-mode): 2.4 cm RVSP:           32.2 mmHg  LEFT ATRIUM             Index       RIGHT ATRIUM           Index LA diam:        3.10 cm 1.65 cm/m  RA Pressure: 3.00 mmHg LA Vol (A2C):   43.5 ml 23.21 ml/m RA Area:     13.10 cm LA Vol (A4C):   32.0 ml 17.08 ml/m RA Volume:   29.60 ml  15.79 ml/m LA Biplane Vol: 37.9 ml 20.22 ml/m AORTIC VALVE LVOT Vmax:   142.00 cm/s LVOT Vmean:  81.900 cm/s LVOT VTI:    0.284 m  AORTA Ao Root diam: 2.40 cm  MITRAL VALVE                        TRICUSPID VALVE MV Area (PHT): 5.20 cm             TR Peak grad:   29.2 mmHg MV PHT:        42.34 msec           TR Vmax:        270.00 cm/s MV Decel Time: 146 msec             Estimated RAP:  3.00 mmHg MV E velocity: 89.50 cm/s 103 cm/s   RVSP:           32.2 mmHg MV A velocity: 77.10 cm/s 70.3 cm/s MV E/A ratio:  1.16       1.5       SHUNTS Systemic VTI:  0.28 m Systemic Diam: 1.85 cm   Charlton Haws MD Electronically signed by Charlton Haws MD Signature Date/Time: 03/13/2019/12:25:10 PM    Final          ______________________________________________________________________________________________        Current Reported Medications:.    Current Meds  Medication Sig   aspirin 81 MG tablet Take 81 mg by mouth daily.   ELDERBERRY PO Take by mouth daily.   ibuprofen (ADVIL) 800 MG tablet Take 1 tablet (800 mg total) by mouth every 8 (eight) hours as needed for moderate pain or cramping.   inclisiran (LEQVIO) 284 MG/1.5ML SOSY injection Inject 284mg  subcutaneously every 6 months. Copay card info ID [id number], BIN [id number], PCN [id number], GRP [id number]   lisinopril (ZESTRIL) 2.5 MG tablet Take 2.5 mg by mouth daily.   Multiple Vitamin (MULTIVITAMIN) capsule Take 1 capsule by mouth daily.    Physical Exam:    VS:  BP 136/68 (BP Location: Right Arm, Patient Position: Sitting, Cuff Size: Normal)   Pulse 61   Ht 5\' 5"  (1.651 m)   Wt 177 lb (80.3 kg)   LMP 09/21/2013   SpO2 99%   BMI 29.45 kg/m    Wt Readings from Last 3 Encounters:  05/24/23 177 lb (80.3 kg)  05/17/23 179 lb 6.4 oz (81.4 kg)  04/12/23 177 lb 9.6 oz (80.6 kg)    GEN: Well nourished, well developed in no acute distress NECK: No JVD; No carotid bruits  CARDIAC: RRR, 1/6 systolic murmur, no rubs or gallops RESPIRATORY:  Clear to auscultation without rales, wheezing or rhonchi  ABDOMEN: Soft, non-tender, non-distended EXTREMITIES:  No edema; No acute deformity   Asessement and Plan:.    Palpitations: Patient reports that in recent weeks she is started noticing increased palpitations that are occurring daily, lasting for nearly 5 minutes.  She describes as a fluttering sensation in her chest.  She denies any associated symptoms.  EKG today  indicates sinus rhythm with PACs.  She notes that she is very sensitive to sugar and caffeine but has cut this out of her diet.  She reports that she tries to stay well-hydrated and has been increasing her fluid intake recently.  Patient agreeable to wearing 2-week cardiac monitor. Check CBC, CMET, Mag and TSH.   CAD: s/p stenting to mid and distal LAD in 2012. Today she reports that she has been doing well aside from palpitations as noted above. Stable with no anginal symptoms. No indication for ischemic evaluation. Heart healthy diet and regular cardiovascular exercise encouraged. Continue aspirin 81 mg daily, inclisiran, lisinopril 2.5 mg daily.  HTN: Blood pressure today 136/68. Continue current antihypertensive regimen.   Dysplipidemia: Last lipid profile on 04/19/2023 indicated total cholesterol 171, HDL 65, triglycerides 40 and LDL 97.  Lipoprotein a 40.7.  Reviewed by Dr. Antoine Poche.  Patient is currently on Leqvio and is unable to tolerate any other cholesterol-lowering medications. Plant-based diet was recommended. Patient notes she recently had her injection this past Friday, has noted a slight headache and body ache since injection.  Notes this is the first time this has occurred since starting on injections in 2022.  She will continue to monitor.    Sleep disordered breathing: STOP-BANG 4.  Discussed potential sleep study, patient deferred sleep study at this time. Will plan to discuss further on follow-up.    Disposition: F/u with Reather Littler, NP in 6-8 weeks.   Signed, Rip Harbour, NP

## 2023-05-24 ENCOUNTER — Encounter: Payer: Self-pay | Admitting: Cardiology

## 2023-05-24 ENCOUNTER — Ambulatory Visit: Payer: 59 | Attending: Cardiology

## 2023-05-24 ENCOUNTER — Ambulatory Visit: Payer: 59 | Attending: Cardiology | Admitting: Cardiology

## 2023-05-24 VITALS — BP 136/68 | HR 61 | Ht 65.0 in | Wt 177.0 lb

## 2023-05-24 DIAGNOSIS — I2510 Atherosclerotic heart disease of native coronary artery without angina pectoris: Secondary | ICD-10-CM | POA: Diagnosis not present

## 2023-05-24 DIAGNOSIS — E782 Mixed hyperlipidemia: Secondary | ICD-10-CM | POA: Diagnosis not present

## 2023-05-24 DIAGNOSIS — I491 Atrial premature depolarization: Secondary | ICD-10-CM

## 2023-05-24 DIAGNOSIS — R002 Palpitations: Secondary | ICD-10-CM

## 2023-05-24 DIAGNOSIS — I10 Essential (primary) hypertension: Secondary | ICD-10-CM | POA: Diagnosis not present

## 2023-05-24 NOTE — Patient Instructions (Signed)
 Medication Instructions:  No changes *If you need a refill on your cardiac medications before your next appointment, please call your pharmacy*  Lab Work: Today we are going to draw CBC, Cmet, TSH, and Mag If you have labs (blood work) drawn today and your tests are completely normal, you will receive your results only by: MyChart Message (if you have MyChart) OR A paper copy in the mail If you have any lab test that is abnormal or we need to change your treatment, we will call you to review the results.  Testing/Procedures: [name]- Long Term Monitor Instructions  Your physician has requested you wear a ZIO patch monitor for 14 days.  This is a single patch monitor. [employer] supplies one patch monitor per enrollment. Additional stickers are not available. Please do not apply patch if you will be having a Nuclear Stress Test,  Echocardiogram, Cardiac CT, MRI, or Chest Xray during the period you would be wearing the  monitor. The patch cannot be worn during these tests. You cannot remove and re-apply the  ZIO XT patch monitor.  Your ZIO patch monitor will be mailed 3 day USPS to your address on file. It may take 3-5 days  to receive your monitor after you have been enrolled.  Once you have received your monitor, please review the enclosed instructions. Your monitor  has already been registered assigning a specific monitor serial # to you.  Billing and Patient Assistance Program Information  We have supplied [employer] with any of your insurance information on file for billing purposes. [employer] offers a sliding scale Patient Assistance Program for patients that do not have  insurance, or whose insurance does not completely cover the cost of the ZIO monitor.  You must apply for the Patient Assistance Program to qualify for this discounted rate.  To apply, please call [employer] at [phone], select option 4, select option 2, ask to apply for  Patient Assistance Program. Meredeth Ide will ask your  household income, and how many people  are in your household. They will quote your out-of-pocket cost based on that information.  [employer] will also be able to set up a 52-month, interest-free payment plan if needed.  Applying the monitor   Shave hair from upper left chest.  Hold abrader disc by orange tab. Rub abrader in 40 strokes over the upper left chest as  indicated in your monitor instructions.  Clean area with 4 enclosed alcohol pads. Let dry.  Apply patch as indicated in monitor instructions. Patch will be placed under collarbone on left  side of chest with arrow pointing upward.  Rub patch adhesive wings for 2 minutes. Remove white label marked "1". Remove the white  label marked "2". Rub patch adhesive wings for 2 additional minutes.  While looking in a mirror, press and release button in center of patch. A small green light will  flash 3-4 times. This will be your only indicator that the monitor has been turned on.  Do not shower for the first 24 hours. You may shower after the first 24 hours.  Press the button if you feel a symptom. You will hear a small click. Record Date, Time and  Symptom in the Patient Logbook.  When you are ready to remove the patch, follow instructions on the last 2 pages of Patient  Logbook. Stick patch monitor onto the last page of Patient Logbook.  Place Patient Logbook in the blue and white box. Use locking tab on box and tape box closed  securely. The blue and white box has prepaid postage on it. Please place it in the mailbox as  soon as possible. Your physician should have your test results approximately 7 days after the  monitor has been mailed back to [facility]  Call [facility] Customer Care at [phone] if you have questions regarding  your ZIO XT patch monitor. Call them immediately if you see an orange light blinking on your  monitor.  If your monitor falls off in less than 4 days, contact our Monitor department at [phone].   If your monitor becomes loose or falls off after 4 days call [employer] at [phone] for  suggestions on securing your monitor  Follow-Up: At [facility], you and your health needs are our priority.  As part of our continuing mission to provide you with exceptional heart care, we have created designated Provider Care Teams.  These Care Teams include your primary Cardiologist (physician) and Advanced Practice Providers (APPs -  Physician Assistants and Nurse Practitioners) who all work together to provide you with the care you need, when you need it.  We recommend signing up for the patient portal called "MyChart".  Sign up information is provided on this After Visit Summary.  MyChart is used to connect with patients for Virtual Visits (Telemedicine).  Patients are able to view lab/test results, encounter notes, upcoming appointments, etc.  Non-urgent messages can be sent to your provider as well.   To learn more about what you can do with MyChart, go to [email or web address].    Your next appointment:   6 week(s)  Provider:   Reather Littler, NP

## 2023-05-24 NOTE — Progress Notes (Unsigned)
Enrolled patient for a 14 day Zio XT monitor to be mailed to patients home  [place] to read

## 2023-05-25 ENCOUNTER — Encounter: Payer: Self-pay | Admitting: Cardiology

## 2023-05-25 LAB — COMPREHENSIVE METABOLIC PANEL
ALT: 29 IU/L (ref 0–32)
AST: 48 IU/L — ABNORMAL HIGH (ref 0–40)
Albumin: 4.8 g/dL (ref 3.8–4.9)
Alkaline Phosphatase: 113 IU/L (ref 44–121)
BUN/Creatinine Ratio: 22 (ref 9–23)
BUN: 19 mg/dL (ref 6–24)
Bilirubin Total: 0.3 mg/dL (ref 0.0–1.2)
CO2: 25 mmol/L (ref 20–29)
Calcium: 9.8 mg/dL (ref 8.7–10.2)
Chloride: 102 mmol/L (ref 96–106)
Creatinine, Ser: 0.86 mg/dL (ref 0.57–1.00)
Globulin, Total: 2.7 g/dL (ref 1.5–4.5)
Glucose: 100 mg/dL — ABNORMAL HIGH (ref 70–99)
Potassium: 4.4 mmol/L (ref 3.5–5.2)
Sodium: 141 mmol/L (ref 134–144)
Total Protein: 7.5 g/dL (ref 6.0–8.5)
eGFR: 78 mL/min/{1.73_m2} (ref >59)

## 2023-05-25 LAB — CBC
Hematocrit: 42.5 % (ref 34.0–46.6)
Hemoglobin: 13.9 g/dL (ref 11.1–15.9)
MCH: 29.4 pg (ref 26.6–33.0)
MCHC: 32.7 g/dL (ref 31.5–35.7)
MCV: 90 fL (ref 79–97)
Platelets: 188 10*3/uL (ref 150–450)
RBC: 4.72 x10E6/uL (ref 3.77–5.28)
RDW: 12.3 % (ref 11.7–15.4)
WBC: 4.1 10*3/uL (ref 3.4–10.8)

## 2023-05-25 LAB — MAGNESIUM: Magnesium: 2.2 mg/dL (ref 1.6–2.3)

## 2023-05-25 LAB — TSH: TSH: 1.52 u[IU]/mL (ref 0.450–4.500)

## 2023-05-29 DIAGNOSIS — I491 Atrial premature depolarization: Secondary | ICD-10-CM

## 2023-05-29 DIAGNOSIS — R002 Palpitations: Secondary | ICD-10-CM | POA: Diagnosis not present

## 2023-06-07 ENCOUNTER — Telehealth: Payer: Self-pay | Admitting: Cardiology

## 2023-06-07 NOTE — Telephone Encounter (Signed)
 Pt is requesting a callback regarding her wanting to take monitor off due to the adhesive causing her to itch but her last day to wear it isn't until Tuesday. She'd like to be advised on what to do. Please advise

## 2023-06-07 NOTE — Telephone Encounter (Signed)
 Patient sent in a mychart  message . Question was answered.  Patient can take off monitor and return it .

## 2023-06-21 ENCOUNTER — Ambulatory Visit: Payer: 59 | Admitting: Cardiology

## 2023-06-26 ENCOUNTER — Telehealth: Payer: Self-pay

## 2023-06-26 NOTE — Telephone Encounter (Signed)
 Left message to call back

## 2023-06-26 NOTE — Telephone Encounter (Signed)
-----   Message from Rip Harbour sent at 06/25/2023 10:32 AM EDT ----- Please let [name] know that her cardiac monitor showed her predominant rhythm was normal sinus rhythm. Her average heart rate was 70 bpm. She had rare early beats from the top chambers of the heart called premature atrial contractions and rare early beats from the bottom chambers of the heart called premature ventricular contractions. Her triggered events were associated with sinus rhythm or with the early beats. Overall good results. We will plan to discuss further on follow up.

## 2023-06-27 NOTE — Telephone Encounter (Signed)
 Called patient advised of below they verbalized understanding After patient saw Charlesetta Garibaldi she had went and got an Pedialyte drink and have been feeling better

## 2023-07-04 NOTE — Progress Notes (Unsigned)
 Cardiology Office Note    Date:  07/05/2023  ID:  AMANPREET DELMONT, DOB May 03, 1965, MRN [id number] PCP:  Georgann Housekeeper, MD  Cardiologist:  Rollene Rotunda, MD  Electrophysiologist:  None   Chief Complaint: Follow up for palpitations   History of Present Illness: [name] is a 58 y.o. female with visit-pertinent history of CAD with stenting to the mid LAD and distal LAD in 2012, hypertension and hyperlipidemia with history of intolerance to atorvastatin, rosuvastatin, pravastatin, Zetia, Praluent, Repatha, Nexletol and WelChol.  Patient was previously followed by Dr. Katrinka Blazing.  She establish care with Dr. Antoine Poche on 04/12/2023.  She reported that she had been doing well at that time.  She was exercising almost daily.  She denied any new symptoms such as chest discomfort, shortness of breath, palpitations, orthopnea or PND.  On 05/24/2023 patient presented regarding increased palpitations that she had noted in the last few weeks.  They were occurring nearly daily typically when she arrived at work and lasting under 5 minutes.  She denied any associated symptoms.  Cardiac monitor indicated predominant rhythm is normal sinus rhythm with no sustained arrhythmias, she had rare ventricular and atrial ectopy.  Today she presents for follow-up.  She reports that she is doing very well overall.  She notes that her palpitations improved after starting to drink more electrolyte-based beverages.  She notes that she has some on occasion still but they are overall nonbothersome.  She denies any chest pain, shortness of breath, lower extreme edema, orthopnea or PND.  She is currently working towards walking 100 miles this month for a cancer walk, she has walked 10 miles already and is tolerating well.  She notes that her flulike symptoms and body aches that she had after her last Leqvio injection have completely resolved.  Labwork independently reviewed: 05/23/2023: Sodium 141, potassium 4.4, creatinine  0.86, AST 48, ALT 29, hemoglobin 13.9, hematocrit 42.5 ROS: .   Today she denies chest pain, shortness of breath, lower extremity edema, fatigue, palpitations, melena, hematuria, hemoptysis, diaphoresis, weakness, presyncope, syncope, orthopnea, and PND.  All other systems are reviewed and otherwise negative. Studies Reviewed: Marland Kitchen   EKG:  EKG is not ordered today.  CV Studies: Cardiac studies reviewed are outlined and summarized above. Otherwise please see EMR for full report. Cardiac Studies & Procedures   ______________________________________________________________________________________________     ECHOCARDIOGRAM  ECHOCARDIOGRAM COMPLETE 03/13/2019  Narrative ECHOCARDIOGRAM REPORT    Patient Name:   [name] Date of Exam: 03/13/2019 Medical Rec #:  [id number]      Height:       65.0 in Accession #:    [id number]     Weight:       176.1 lb Date of Birth:  1965-11-27      BSA:          1.87 m Patient Age:    53 years       BP:           118/76 mmHg Patient Gender: F              HR:           61 bpm. Exam Location:  [address]  Procedure: 2D Echo, Cardiac Doppler and Color Doppler  Indications:    R01.1 Murmur  History:        Patient has no prior history of Echocardiogram examinations. CAD; Risk Factors:Hypertension, Dyslipidemia and Family History of Coronary Artery Disease.  Sonographer:  Cathie Beams RCS Referring Phys: [phone] Barry Dienes [facility]  IMPRESSIONS   1. Left ventricular ejection fraction, by visual estimation, is 60 to 65%. The left ventricle has normal function. There is no left ventricular hypertrophy. 2. The left ventricle has no regional wall motion abnormalities. 3. Global right ventricle has normal systolic function.The right ventricular size is normal. No increase in right ventricular wall thickness. 4. Left atrial size was normal. 5. Right atrial size was normal. 6. The mitral valve is normal in structure. No evidence of mitral valve  regurgitation. No evidence of mitral stenosis. 7. The tricuspid valve is normal in structure. Tricuspid valve regurgitation is not demonstrated. 8. The aortic valve is normal in structure. Aortic valve regurgitation is not visualized. No evidence of aortic valve sclerosis or stenosis. 9. The pulmonic valve was normal in structure. Pulmonic valve regurgitation is not visualized. 10. Mildly elevated pulmonary artery systolic pressure. 11. The inferior vena cava is normal in size with greater than 50% respiratory variability, suggesting right atrial pressure of 3 mmHg.  FINDINGS Left Ventricle: Left ventricular ejection fraction, by visual estimation, is 60 to 65%. The left ventricle has normal function. The left ventricle has no regional wall motion abnormalities. There is no left ventricular hypertrophy. Left ventricular diastolic parameters were normal. Normal left atrial pressure.  Right Ventricle: The right ventricular size is normal. No increase in right ventricular wall thickness. Global RV systolic function is has normal systolic function. The tricuspid regurgitant velocity is 2.70 m/s, and with an assumed right atrial pressure of 10 mmHg, the estimated right ventricular systolic pressure is mildly elevated at 39.2 mmHg.  Left Atrium: Left atrial size was normal in size.  Right Atrium: Right atrial size was normal in size  Pericardium: There is no evidence of pericardial effusion.  Mitral Valve: The mitral valve is normal in structure. No evidence of mitral valve regurgitation. No evidence of mitral valve stenosis by observation.  Tricuspid Valve: The tricuspid valve is normal in structure. Tricuspid valve regurgitation is not demonstrated.  Aortic Valve: The aortic valve is normal in structure. Aortic valve regurgitation is not visualized. The aortic valve is structurally normal, with no evidence of sclerosis or stenosis.  Pulmonic Valve: The pulmonic valve was normal in structure.  Pulmonic valve regurgitation is not visualized. Pulmonic regurgitation is not visualized.  Aorta: The aortic root, ascending aorta and aortic arch are all structurally normal, with no evidence of dilitation or obstruction.  Venous: The inferior vena cava is normal in size with greater than 50% respiratory variability, suggesting right atrial pressure of 3 mmHg.  IAS/Shunts: No atrial level shunt detected by color flow Doppler. There is no evidence of a patent foramen ovale. No ventricular septal defect is seen or detected. There is no evidence of an atrial septal defect.   LEFT VENTRICLE PLAX 2D LVIDd:         4.00 cm  Diastology LVIDs:         2.40 cm  LV e' lateral:   14.70 cm/s LV PW:         0.90 cm  LV E/e' lateral: 6.1 LV IVS:        1.00 cm  LV e' medial:    11.10 cm/s LVOT diam:     1.85 cm  LV E/e' medial:  8.1 LV SV:         50 ml LV SV Index:   25.72 LVOT Area:     2.69 cm   RIGHT VENTRICLE RV Basal  diam:  2.10 cm RV S prime:     15.80 cm/s TAPSE (M-mode): 2.4 cm RVSP:           32.2 mmHg  LEFT ATRIUM             Index       RIGHT ATRIUM           Index LA diam:        3.10 cm 1.65 cm/m  RA Pressure: 3.00 mmHg LA Vol (A2C):   43.5 ml 23.21 ml/m RA Area:     13.10 cm LA Vol (A4C):   32.0 ml 17.08 ml/m RA Volume:   29.60 ml  15.79 ml/m LA Biplane Vol: 37.9 ml 20.22 ml/m AORTIC VALVE LVOT Vmax:   142.00 cm/s LVOT Vmean:  81.900 cm/s LVOT VTI:    0.284 m  AORTA Ao Root diam: 2.40 cm  MITRAL VALVE                        TRICUSPID VALVE MV Area (PHT): 5.20 cm             TR Peak grad:   29.2 mmHg MV PHT:        42.34 msec           TR Vmax:        270.00 cm/s MV Decel Time: 146 msec             Estimated RAP:  3.00 mmHg MV E velocity: 89.50 cm/s 103 cm/s  RVSP:           32.2 mmHg MV A velocity: 77.10 cm/s 70.3 cm/s MV E/A ratio:  1.16       1.5       SHUNTS Systemic VTI:  0.28 m Systemic Diam: 1.85 cm   Charlton Haws MD Electronically signed by Charlton Haws MD Signature Date/Time: 03/13/2019/12:25:10 PM    Final    MONITORS  LONG TERM MONITOR (3-14 DAYS) 06/19/2023  Narrative Normal sinus rhythm No sustained arrhythmias Rare ventricular and atrial ectopy.       ______________________________________________________________________________________________       Current Reported Medications:.    Current Meds  Medication Sig   aspirin 81 MG tablet Take 81 mg by mouth daily.   ELDERBERRY PO Take by mouth daily.   ibuprofen (ADVIL) 800 MG tablet Take 1 tablet (800 mg total) by mouth every 8 (eight) hours as needed for moderate pain or cramping.   inclisiran (LEQVIO) 284 MG/1.5ML SOSY injection Inject 284mg  subcutaneously every 6 months. Copay card info ID [id number], BIN [id number], PCN [id number], GRP [id number]   lisinopril (ZESTRIL) 2.5 MG tablet Take 2.5 mg by mouth daily.   Multiple Vitamin (MULTIVITAMIN) capsule Take 1 capsule by mouth daily.   nitroGLYCERIN (NITROSTAT) 0.4 MG SL tablet Place 1 tablet (0.4 mg total) under the tongue every 5 (five) minutes as needed for chest pain.   Physical Exam:    VS:  BP 130/80   Pulse 70   Ht 5\' 5"  (1.651 m)   Wt 177 lb 6.4 oz (80.5 kg)   LMP 09/21/2013   SpO2 97%   BMI 29.52 kg/m    Wt Readings from Last 3 Encounters:  07/05/23 177 lb 6.4 oz (80.5 kg)  05/24/23 177 lb (80.3 kg)  05/17/23 179 lb 6.4 oz (81.4 kg)    GEN: Well nourished, well developed in no acute distress NECK: No JVD; No carotid bruits CARDIAC: RRR, no  murmurs, rubs, gallops RESPIRATORY:  Clear to auscultation without rales, wheezing or rhonchi  ABDOMEN: Soft, non-tender, non-distended EXTREMITIES:  No edema; No acute deformity     Asessement and Plan:.    Palpitations:  Cardiac monitor indicated predominant rhythm is normal sinus rhythm with no sustained arrhythmias, she had rare ventricular and atrial ectopy.  Triggered events were associated with sinus rhythm or ectopy. Today she reports that she is doing  well overall, she notes her palpitations have improved with increasing her electrolytes.  She notes she still has some palpitations, not bothersome to her.  She will continue to monitor and hydrate well, if she is any worsening symptoms or new feelings palpitation she will notify the office.  CAD: S/p stenting to mid and distal LAD in 2012. Stable with no anginal symptoms. No indication for ischemic evaluation. Heart healthy diet and regular cardiovascular exercise encouraged.  Continue aspirin 81 mg daily, inclisiran, lisinopril 2.5 mg daily.  Hypertension: Blood pressure today 130/80. Continue current antihypertensive regimen.  Dyslipidemia: Last lipid profile in 04/19/2023 indicated total cholesterol 171, HDL 65, triglycerides 40 and LDL 97.  Lipoprotein a 40.7.  Reviewed by Dr. Antoine Poche.  Patient is currently on Leqvio and has been unable to tolerate any other cholesterol-lowering medications.  Patient noted some flulike symptoms after her last Leqvio injection, she notes these have completely resolved at this time.  Sleep disordered breathing:  STOP-BANG previously of 4.  Patient notes that she is feeling very well overall and her palpitations have improved.  Will defer sleep study at this time.   Disposition: F/u with Dr. Antoine Poche or Reather Littler, NP in one year.   Signed, Rip Harbour, NP

## 2023-07-05 ENCOUNTER — Ambulatory Visit: Payer: 59 | Attending: Cardiology | Admitting: Cardiology

## 2023-07-05 ENCOUNTER — Encounter: Payer: Self-pay | Admitting: Cardiology

## 2023-07-05 VITALS — BP 130/80 | HR 70 | Ht 65.0 in | Wt 177.4 lb

## 2023-07-05 DIAGNOSIS — E782 Mixed hyperlipidemia: Secondary | ICD-10-CM | POA: Diagnosis not present

## 2023-07-05 DIAGNOSIS — I491 Atrial premature depolarization: Secondary | ICD-10-CM | POA: Diagnosis not present

## 2023-07-05 DIAGNOSIS — R002 Palpitations: Secondary | ICD-10-CM

## 2023-07-05 DIAGNOSIS — I10 Essential (primary) hypertension: Secondary | ICD-10-CM

## 2023-07-05 DIAGNOSIS — I2510 Atherosclerotic heart disease of native coronary artery without angina pectoris: Secondary | ICD-10-CM | POA: Diagnosis not present

## 2023-07-05 NOTE — Patient Instructions (Signed)
   Follow-Up: At [facility], you and your health needs are our priority.  As part of our continuing mission to provide you with exceptional heart care, our providers are all part of one team.  This team includes your primary Cardiologist (physician) and Advanced Practice Providers or APPs (Physician Assistants and Nurse Practitioners) who all work together to provide you with the care you need, when you need it.  Your next appointment:   12 month(s)  Provider:   Rollene Rotunda, MD  or Reather Littler NP   We recommend signing up for the patient portal called "MyChart".  Sign up information is provided on this After Visit Summary.  MyChart is used to connect with patients for Virtual Visits (Telemedicine).  Patients are able to view lab/test results, encounter notes, upcoming appointments, etc.  Non-urgent messages can be sent to your provider as well.   To learn more about what you can do with MyChart, go to [email or web address].         1st Floor: - Lobby - Registration  - Pharmacy  - Lab - Cafe  2nd Floor: - PV Lab - Diagnostic Testing (echo, CT, nuclear med)  3rd Floor: - Vacant  4th Floor: - TCTS (cardiothoracic surgery) - AFib Clinic - Structural Heart Clinic - Vascular Surgery  - Vascular Ultrasound  5th Floor: - HeartCare Cardiology (general and EP) - Clinical Pharmacy for coumadin, hypertension, lipid, weight-loss medications, and med management appointments    Valet parking services will be available as well.

## 2023-11-15 ENCOUNTER — Encounter: Payer: Self-pay | Admitting: Cardiology

## 2023-11-15 ENCOUNTER — Ambulatory Visit: Payer: 59

## 2023-11-22 ENCOUNTER — Ambulatory Visit

## 2023-11-22 ENCOUNTER — Encounter: Payer: Self-pay | Admitting: Podiatry

## 2023-11-22 ENCOUNTER — Ambulatory Visit: Admitting: Podiatry

## 2023-11-22 VITALS — BP 134/78 | HR 61 | Temp 98.4°F | Resp 16 | Ht 65.0 in | Wt 181.4 lb

## 2023-11-22 DIAGNOSIS — L600 Ingrowing nail: Secondary | ICD-10-CM | POA: Diagnosis not present

## 2023-11-22 DIAGNOSIS — B351 Tinea unguium: Secondary | ICD-10-CM

## 2023-11-22 DIAGNOSIS — I2510 Atherosclerotic heart disease of native coronary artery without angina pectoris: Secondary | ICD-10-CM

## 2023-11-22 DIAGNOSIS — E782 Mixed hyperlipidemia: Secondary | ICD-10-CM | POA: Diagnosis not present

## 2023-11-22 MED ORDER — INCLISIRAN SODIUM 284 MG/1.5ML ~~LOC~~ SOSY
284.0000 mg | PREFILLED_SYRINGE | Freq: Once | SUBCUTANEOUS | Status: AC
  Administered 2023-11-22: 284 mg via SUBCUTANEOUS
  Filled 2023-11-22: qty 1.5

## 2023-11-22 NOTE — Progress Notes (Signed)
 Diagnosis: Osteoporosis  Provider:  Mannam, Praveen MD  Procedure: Injection  Leqvio  (inclisiran), Dose: 284 mg, Site: subcutaneous, Number of injections: 2  Injection Site(s): Left vastus lateralis and Left lower quad. abdomen  Post Care: Observation period completed  Discharge: Condition: Good, Destination: Home . AVS Declined  Performed by:  Trudy Lamarr LABOR, RN

## 2023-11-22 NOTE — Progress Notes (Signed)
 Subjective:   Patient ID: [name], female   DOB: 58 y.o.   MRN: [id number]   HPI Chief Complaint  Patient presents with   Nail Problem    Patient concern with thick nails that star to curve inward, needs debridement, 1 pain. Non diabetic.   58 year old female presents the office above concerns.  She states that in particular her big toenails started occurring for the left big toenail worse than the right side and has difficulty trimming them.  At max level the pain is 1/10 and denies any drainage or pus.  No other treatment.  She also notes the fifth digit toenails have also become thicker.   Review of Systems  All other systems reviewed and are negative.  Past Medical History:  Diagnosis Date   Coronary artery disease cardiologist-  dr victory sharps   09-26-2010  cardiac cath w/ PCI and DES x2 to LAD   Family history of early CAD    Fatty liver    Hyperlipidemia    Hypertension    PMB (postmenopausal bleeding)    S/P drug eluting coronary stent placement 09/26/2010   x2 to LAD   Uterine leiomyoma    Wears glasses     Past Surgical History:  Procedure Laterality Date   BREAST BIOPSY Right 12/30/2012   Procedure: CYST EXCISION BREAST;  Surgeon: Debby A. Cornett, MD;  Location: [facility];  Service: General;  Laterality: Right;   COLONOSCOPY WITH PROPOFOL   2016   CORONARY ANGIOPLASTY WITH STENT PLACEMENT  09-26-2010   dr victory sharps   unstable angina w/ exertion & abnormal ETT-- PCI and DES to midLAD and distalLAD (x2 stents),  normal LVF, ef 70%   DILATATION & CURETTAGE/HYSTEROSCOPY WITH MYOSURE N/A 09/26/2017   Procedure: DILATATION & CURETTAGE/HYSTEROSCOPY WITH MYOSURE;  Surgeon: Gloriann Chick, MD;  Location: [facility];  Service: Gynecology;  Laterality: N/A;   DILATATION & CURETTAGE/HYSTEROSCOPY WITH MYOSURE N/A 02/11/2020   Procedure: DILATATION & CURETTAGE/HYSTEROSCOPY WITH MYOSURE;  Surgeon: Gloriann Chick, MD;  Location: [facility];  Service: Gynecology;  Laterality: N/A;     Current Outpatient Medications:    aspirin  81 MG tablet, Take 81 mg by mouth daily., Disp: , Rfl:    ELDERBERRY PO, Take by mouth daily., Disp: , Rfl:    ibuprofen  (ADVIL ) 800 MG tablet, Take 1 tablet (800 mg total) by mouth every 8 (eight) hours as needed for moderate pain or cramping., Disp: 30 tablet, Rfl: 0   inclisiran (LEQVIO ) 284 MG/1.5ML SOSY injection, Inject 284mg  subcutaneously every 6 months. Copay card info ID [id number], BIN [id number], PCN OHCP, GRP [id number], Disp: 1.5 mL, Rfl: 1   lisinopril (ZESTRIL) 2.5 MG tablet, Take 2.5 mg by mouth daily., Disp: , Rfl:    Multiple Vitamin (MULTIVITAMIN) capsule, Take 1 capsule by mouth daily., Disp: , Rfl:    nitroGLYCERIN  (NITROSTAT ) 0.4 MG SL tablet, Place 1 tablet (0.4 mg total) under the tongue every 5 (five) minutes as needed for chest pain., Disp: 25 tablet, Rfl: 3 No current facility-administered medications for this visit.  Facility-Administered Medications Ordered in Other Visits:    inclisiran (LEQVIO ) injection 284 mg, 284 mg, Subcutaneous, Once, Hochrein, James, MD  Allergies  Allergen Reactions   Erythromycin Other (See Comments)    Stomach cramps   Crestor  [Rosuvastatin ]     Severe muscle aches on Crestor  5 mg once daily   Lipitor [Atorvastatin] Other (See Comments)    Aches in muscles/barely can move  Nexletol  [Bempedoic Acid ]     myalgias   Praluent [Alirocumab]     myalgias   Pravastatin      Myalgias on 10mg  every other day   Repatha  [Evolocumab ]     myalgias   Welchol  [Colesevelam ]     GI upset. Ineffective at lowering cholesterol.   Zetia  [Ezetimibe ]     myalgias   Livalo  [Pitavastatin ] Other (See Comments)    Increased CK          Objective:  Physical Exam  General: AAO x3, NAD  Dermatological: Bilateral hallux as well as fifth digit nails are hypertrophic, dystrophic with yellow, brown discoloration.  There is incurvation present to both the  medial and lateral aspects of the hallux toenails but there is no edema, erythema or signs of infection today.  There are no open lesions identified.  Vascular: Dorsalis Pedis artery and Posterior Tibial artery pedal pulses are 2/4 bilateral with immedate capillary fill time. Pedal hair growth present. No varicosities and no lower extremity edema present bilateral. There is no pain with calf compression, swelling, warmth, erythema.   Neruologic: Grossly intact via light touch bilateral.   Musculoskeletal: No pain on exam today.  Gait: Unassisted, Nonantalgic.       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  Today a sharp debrided the nails and complications of bleeding.  In the for culture, pathology.  Will await the results of the culture before proceeding with further treatment for possible nail fungus versus dystrophy. -Discussed ingrown toenails and is able to debride them today.  Should they become symptomatic or problematic discussed partial nail avulsions.    [name] DPM

## 2023-11-28 ENCOUNTER — Other Ambulatory Visit: Payer: Self-pay | Admitting: Podiatry

## 2023-11-29 ENCOUNTER — Ambulatory Visit: Payer: Self-pay | Admitting: Podiatry

## 2023-12-03 ENCOUNTER — Other Ambulatory Visit: Payer: Self-pay | Admitting: Podiatry

## 2023-12-03 MED ORDER — EFINACONAZOLE 10 % EX SOLN: 1.0000 [drp] | Freq: Every day | CUTANEOUS | 11 refills | Status: DC

## 2024-02-18 ENCOUNTER — Telehealth (HOSPITAL_BASED_OUTPATIENT_CLINIC_OR_DEPARTMENT_OTHER): Payer: Self-pay | Admitting: *Deleted

## 2024-02-18 NOTE — Telephone Encounter (Signed)
 Left message to call back to schedule tele pre op appt.

## 2024-02-18 NOTE — Telephone Encounter (Signed)
   Name: [name]  DOB: December 24, 1965  MRN: [id number]  Primary Cardiologist: Lynwood Schilling, MD   Preoperative team, please contact this patient and set up a phone call appointment for further preoperative risk assessment. Please obtain consent and complete medication review. Thank you for your help.  I confirm that guidance regarding antiplatelet and oral anticoagulation therapy has been completed and, if necessary, noted below.  I also confirmed the patient resides in the state of [place] . As per [facility] Medical Board telemedicine laws, the patient must reside in the state in which the provider is licensed.   Jon Nat Hails, PA 02/18/2024, 1:28 PM [facility]

## 2024-02-18 NOTE — Telephone Encounter (Signed)
   Pre-operative Risk Assessment    Patient Name: [name]  DOB: October 26, 1965 MRN: [id number]   Date of last office visit: 07/05/23 ROSABEL MOSE, NP Date of next office visit: NONE   Request for Surgical Clearance    Procedure:  RIGHT SHOULDER ARTHROSCOPY RCR, SAD  Date of Surgery:  Clearance 03/24/24                                Surgeon:  DR. EVA HERRING Surgeon's Group or Practice Name:  [facility] Phone number:  [phone] VINA BONINE Fax number:  [phone]   Type of Clearance Requested:   - Medical  - Pharmacy:  Hold Aspirin      Type of Anesthesia:  CHOICE   Additional requests/questions:    [name]   02/18/2024, 12:45 PM

## 2024-02-19 NOTE — Telephone Encounter (Signed)
 Spoke to patient and she is scheduled for pre-op clearance tele visit on 03/13/24 with Lum Louis, NP       Patient Consent for Virtual Visit        [name] has provided verbal consent on 02/19/2024 for a virtual visit (video or telephone).   CONSENT FOR VIRTUAL VISIT FOR:  [name]  By participating in this virtual visit I agree to the following:  I hereby voluntarily request, consent and authorize [facility] and its employed or contracted physicians, physician assistants, nurse practitioners or other licensed health care professionals (the Practitioner), to provide me with telemedicine health care services (the "Services) as deemed necessary by the treating Practitioner. I acknowledge and consent to receive the Services by the Practitioner via telemedicine. I understand that the telemedicine visit will involve communicating with the Practitioner through live audiovisual communication technology and the disclosure of certain medical information by electronic transmission. I acknowledge that I have been given the opportunity to request an in-person assessment or other available alternative prior to the telemedicine visit and am voluntarily participating in the telemedicine visit.  I understand that I have the right to withhold or withdraw my consent to the use of telemedicine in the course of my care at any time, without affecting my right to future care or treatment, and that the Practitioner or I may terminate the telemedicine visit at any time. I understand that I have the right to inspect all information obtained and/or recorded in the course of the telemedicine visit and may receive copies of available information for a reasonable fee.  I understand that some of the potential risks of receiving the Services via telemedicine include:  Delay or interruption in medical evaluation due to technological equipment failure or disruption; Information transmitted may not  be sufficient (e.g. poor resolution of images) to allow for appropriate medical decision making by the Practitioner; and/or  In rare instances, security protocols could fail, causing a breach of personal health information.  Furthermore, I acknowledge that it is my responsibility to provide information about my medical history, conditions and care that is complete and accurate to the best of my ability. I acknowledge that Practitioner's advice, recommendations, and/or decision may be based on factors not within their control, such as incomplete or inaccurate data provided by me or distortions of diagnostic images or specimens that may result from electronic transmissions. I understand that the practice of medicine is not an exact science and that Practitioner makes no warranties or guarantees regarding treatment outcomes. I acknowledge that a copy of this consent can be made available to me via my patient portal [facility] MyChart), or I can request a printed copy by calling the office of [facility].    I understand that my insurance will be billed for this visit.   I have read or had this consent read to me. I understand the contents of this consent, which adequately explains the benefits and risks of the Services being provided via telemedicine.  I have been provided ample opportunity to ask questions regarding this consent and the Services and have had my questions answered to my satisfaction. I give my informed consent for the services to be provided through the use of telemedicine in my medical care

## 2024-03-13 ENCOUNTER — Ambulatory Visit: Attending: Cardiology | Admitting: Emergency Medicine

## 2024-03-13 DIAGNOSIS — Z01810 Encounter for preprocedural cardiovascular examination: Secondary | ICD-10-CM

## 2024-03-13 NOTE — Progress Notes (Signed)
 Virtual Visit via Telephone Note   Because of [name] co-morbid illnesses, she is at least at moderate risk for complications without adequate follow up.  This format is felt to be most appropriate for this patient at this time.  Due to technical limitations with video connection (technology), today's appointment will be conducted as an audio only telehealth visit, and [name] verbally agreed to proceed in this manner.   All issues noted in this document were discussed and addressed.  No physical exam could be performed with this format.  Evaluation Performed:  Preoperative cardiovascular risk assessment _____________   Date:  03/13/2024   Patient ID:  [name], DOB July 03, 1965, MRN [id number] Patient Location:  Home Provider location:   Office  Primary Care Provider:  Ransom Other, MD Primary Cardiologist:  Lynwood Schilling, MD  Chief Complaint / Patient Profile   58 y.o. y/o female with a h/o palpitations, coronary artery disease, hypertension, hyperlipidemia, sleep disordered breathing who is pending right shoulder arthroscopy on 03/24/2024 by [facility] and presents today for telephonic preoperative cardiovascular risk assessment.  History of Present Illness    [name] is a 58 y.o. female who presents via audio/video conferencing for a telehealth visit today.  Pt was last seen in cardiology clinic on 07/05/2023 by Katlyn West, NP.  At that time [facility] was doing well.  The patient is now pending procedure as outlined above.   Since her last visit, she denies chest pain, shortness of breath, lower extremity edema, fatigue, palpitations, melena, hematuria, hemoptysis, diaphoresis, weakness, presyncope, syncope, orthopnea, and PND.  Today patient is doing well without acute cardiovascular concerns or complaints.  She stays active at home where she routinely exercises without any exertional symptoms.  She is without symptoms concerning for active angina.   She is able to complete greater than 4 METS.  Past Medical History    Past Medical History:  Diagnosis Date   Coronary artery disease cardiologist-  dr victory sharps   09-26-2010  cardiac cath w/ PCI and DES x2 to LAD   Family history of early CAD    Fatty liver    Hyperlipidemia    Hypertension    PMB (postmenopausal bleeding)    S/P drug eluting coronary stent placement 09/26/2010   x2 to LAD   Uterine leiomyoma    Wears glasses    Past Surgical History:  Procedure Laterality Date   BREAST BIOPSY Right 12/30/2012   Procedure: CYST EXCISION BREAST;  Surgeon: Debby A. Cornett, MD;  Location: [facility];  Service: General;  Laterality: Right;   COLONOSCOPY WITH PROPOFOL   2016   CORONARY ANGIOPLASTY WITH STENT PLACEMENT  09-26-2010   dr victory sharps   unstable angina w/ exertion & abnormal ETT-- PCI and DES to midLAD and distalLAD (x2 stents),  normal LVF, ef 70%   DILATATION & CURETTAGE/HYSTEROSCOPY WITH MYOSURE N/A 09/26/2017   Procedure: DILATATION & CURETTAGE/HYSTEROSCOPY WITH MYOSURE;  Surgeon: Gloriann Chick, MD;  Location: [facility];  Service: Gynecology;  Laterality: N/A;   DILATATION & CURETTAGE/HYSTEROSCOPY WITH MYOSURE N/A 02/11/2020   Procedure: DILATATION & CURETTAGE/HYSTEROSCOPY WITH MYOSURE;  Surgeon: Gloriann Chick, MD;  Location: [facility];  Service: Gynecology;  Laterality: N/A;    Allergies  Allergies[1]  Home Medications    Prior to Admission medications  Medication Sig Start Date End Date Taking? Authorizing Provider  aspirin  81 MG tablet Take 81 mg by mouth daily.  [provider]  Efinaconazole  10 % SOLN Apply 1 drop topically daily. Patient not taking: Reported on 02/19/2024 12/03/23   Gershon Donnice SAUNDERS, DPM  ELDERBERRY PO Take by mouth daily. Patient not taking: Reported on 02/19/2024    [provider]  Fexofenadine HCl (ALLEGRA PO) Take by mouth as needed (seasonal).    [provider]  ibuprofen  (ADVIL ) 800 MG tablet Take 1 tablet (800 mg total) by mouth every 8 (eight) hours as needed for moderate pain or cramping. 02/11/20   Gloriann Chick, MD  inclisiran (LEQVIO ) 284 MG/1.5ML SOSY injection Inject 284mg  subcutaneously every 6 months. Copay card info ID [id number], BIN [id number], PCN [place], [facility] [id number] 05/02/23   Lynwood Schilling, MD  lisinopril (ZESTRIL) 2.5 MG tablet Take 2.5 mg by mouth daily.    [provider]  Multiple Vitamin (MULTIVITAMIN) capsule Take 1 capsule by mouth daily.    [provider]  nitroGLYCERIN  (NITROSTAT ) 0.4 MG SL tablet Place 1 tablet (0.4 mg total) under the tongue every 5 (five) minutes as needed for chest pain. 06/16/13   Claudene Victory ORN, MD  Polyethylene Glycol 400 (VISINE DRY EYE RELIEF) 1 % SOLN Apply 1 drop to eye as needed.    [provider]    Physical Exam    Vital Signs:  [name] does not have vital signs available for review today.  Given telephonic nature of communication, physical exam is limited. AAOx3. NAD. Normal affect.  Speech and respirations are unlabored.  Accessory Clinical Findings    None  Assessment & Plan    1.  Preoperative Cardiovascular Risk Assessment: According to the Revised Cardiac Risk Index (RCRI), her Perioperative Risk of Major Cardiac Event is (%): 0.9. Her Functional Capacity in METs is: 5.07 according to the Duke Activity Status Index (DASI).   Therefore, based on ACC/AHA guidelines, patient would be at acceptable risk for the planned procedure without further cardiovascular testing. I will route this recommendation to the requesting party via Epic fax function.  The patient was advised that if she develops new symptoms prior to surgery to contact our office to arrange for a follow-up visit, and she verbalized understanding.  Ideally aspirin  should be continued without interruption, however if the bleeding risk is too great, aspirin  may be held for 5-7 days prior to  surgery. Please resume aspirin  post operatively when it is felt to be safe from a bleeding standpoint.    A copy of this note will be routed to requesting surgeon.  Time:   Today, I have spent 10 minutes with the patient with telehealth technology discussing medical history, symptoms, and management plan.     Lum LITTIE Louis, NP  03/13/2024, 9:09 AM      [1]  Allergies Allergen Reactions   Erythromycin Other (See Comments)    Stomach cramps   Crestor  [Rosuvastatin ]     Severe muscle aches on Crestor  5 mg once daily   Lipitor [Atorvastatin] Other (See Comments)    Aches in muscles/barely can move   Nexletol  [Bempedoic Acid ]     myalgias   Praluent [Alirocumab]     myalgias   Pravastatin      Myalgias on 10mg  every other day   Repatha  [Evolocumab ]     myalgias   Welchol  [Colesevelam ]     GI upset. Ineffective at lowering cholesterol.   Zetia  [Ezetimibe ]     myalgias   Livalo  [Pitavastatin ] Other (See Comments)    Increased CK

## 2024-04-03 ENCOUNTER — Telehealth: Payer: Self-pay | Admitting: Pharmacy Technician

## 2024-04-03 NOTE — Telephone Encounter (Signed)
 Auth Submission: APPROVED Site of care: Site of care: [facility] Payer: [employer] Medication & CPT/J Code(s) submitted: Leqvio  (Inclisiran) J1306 Diagnosis Code: I25.10 Route of submission (phone, fax, portal): PORTAL Phone # Fax # Auth type: Buy/Bill PB Units/visits requested: X2 DOSES Q6 MONTHS Reference number: [id number] Approval from: 04/04/23 to 04/03/25

## 2024-04-08 ENCOUNTER — Ambulatory Visit: Admitting: Podiatry

## 2024-04-08 DIAGNOSIS — B351 Tinea unguium: Secondary | ICD-10-CM

## 2024-04-08 DIAGNOSIS — L600 Ingrowing nail: Secondary | ICD-10-CM

## 2024-04-08 NOTE — Progress Notes (Signed)
 This patient presents to the office with chief complaint of long thick painful nails.  Patient says the nails are painful walking and wearing shoes.  This patient is unable to self treat.  This patient is unable to trim her nails since she is unable to reach her nails.  She presents to the office for preventative foot care services.  General Appearance  Alert, conversant and in no acute stress.  Vascular  Dorsalis pedis and posterior tibial  pulses are palpable  bilaterally.  Capillary return is within normal limits  bilaterally. Temperature is within normal limits  bilaterally.  Neurologic  Senn-Weinstein monofilament wire test within normal limits  bilaterally. Muscle power within normal limits bilaterally.  Nails Thick disfigured discolored nails with subungual debris  from hallux to fifth toes bilaterally. No evidence of bacterial infection or drainage bilaterally. Incurvation noted hallux nails  B/L.  Orthopedic  No limitations of motion  feet .  No crepitus or effusions noted.  No bony pathology or digital deformities noted. HAV  B/L.  Skin  normotropic skin with no porokeratosis noted bilaterally.  No signs of infections or ulcers noted.     Onychomycosis  Nails  B/L.  Pain in right toes  Pain in left toes  Debridement of nails both feet followed trimming the nails with dremel tool.    RTC 3 months.   Cordella Bold DPM

## 2024-05-29 ENCOUNTER — Ambulatory Visit

## 2024-06-19 ENCOUNTER — Ambulatory Visit: Admitting: Podiatry
